# Patient Record
Sex: Male | Born: 1985 | Race: Black or African American | Hispanic: No | Marital: Married | Smoking: Current some day smoker
Health system: Southern US, Community
[De-identification: ages and names within clinical notes are randomized; demographics above are authoritative.]

## PROBLEM LIST (undated history)

## (undated) DIAGNOSIS — J189 Pneumonia, unspecified organism: Secondary | ICD-10-CM

## (undated) DIAGNOSIS — G5600 Carpal tunnel syndrome, unspecified upper limb: Secondary | ICD-10-CM

## (undated) DIAGNOSIS — R7303 Prediabetes: Secondary | ICD-10-CM

## (undated) DIAGNOSIS — M5430 Sciatica, unspecified side: Secondary | ICD-10-CM

## (undated) DIAGNOSIS — J45909 Unspecified asthma, uncomplicated: Secondary | ICD-10-CM

## (undated) DIAGNOSIS — G4730 Sleep apnea, unspecified: Secondary | ICD-10-CM

## (undated) DIAGNOSIS — F909 Attention-deficit hyperactivity disorder, unspecified type: Secondary | ICD-10-CM

## (undated) HISTORY — PX: OTHER SURGICAL HISTORY: SHX169

## (undated) HISTORY — DX: Attention-deficit hyperactivity disorder, unspecified type: F90.9

## (undated) HISTORY — PX: VARICOSE VEIN SURGERY: SHX832

## (undated) HISTORY — DX: Sciatica, unspecified side: M54.30

---

## 2018-10-02 ENCOUNTER — Emergency Department (HOSPITAL_COMMUNITY): Payer: Self-pay

## 2018-10-02 ENCOUNTER — Emergency Department (HOSPITAL_COMMUNITY)
Admission: EM | Admit: 2018-10-02 | Discharge: 2018-10-02 | Disposition: A | Discharge status: Home / Self Care | Payer: Self-pay | Attending: Emergency Medicine | Admitting: Emergency Medicine

## 2018-10-02 ENCOUNTER — Other Ambulatory Visit: Payer: Self-pay

## 2018-10-02 ENCOUNTER — Encounter (HOSPITAL_COMMUNITY): Payer: Self-pay | Admitting: Emergency Medicine

## 2018-10-02 DIAGNOSIS — M25572 Pain in left ankle and joints of left foot: Secondary | ICD-10-CM | POA: Insufficient documentation

## 2018-10-02 NOTE — Discharge Instructions (Addendum)
You were evaluated today for left ankle pain.  Your x-ray was negative for fracture dislocation.  You might have a sprain or a strain of your left ankle.  We have placed a brace.  Please continue using your crutches.  May take Tylenol and ibuprofen for pain management as well as ice and elevate the extremity.  Please follow-up with orthopedist listed on your discharge instructions if you continue to have pain.  Return to the ED for any new or worsening symptoms.

## 2018-10-02 NOTE — ED Provider Notes (Signed)
[facility]   CSN: [id number] Arrival date & time: 10/02/18  1622   History   Chief Complaint Chief Complaint  Patient presents with  . Ankle Pain    HPI Eric Blackwell is a 32 y.o. male with no symptom past medical history who presents for evaluation of left ankle pain after mechanical fall.  Patient states he was carrying a box this morning and missed a step and fell going down 2 stairs while inverting his left ankle. Patient had immediate pain to his left ankle after incident.  Rates his pain a 6/10.  Patient states his pain is located to the medial and lateral malleolus as well as the arch of his left foot.  Denies previous injuries or trauma to left lower extremity.  Patient has not taken anything for his pain.  Has been putting ice and elevating his leg since the incident.  Patient states he has been using crutches because it is painful to walk.  Denies fever, chills, nausea, vomiting, numbness, tingling in bilateral lower extremity, color change to extremity.  Denies pain to the bilateral knees, lumbar spine, hips, tibia/fibula.  Denies aggravating or alleviating factors unless otherwise stated in HPI. Denies hitting head, loss of consciousness or falling on his lower back.  History obtained from patient.  No interpreter was used.  HPI  History reviewed. No pertinent past medical history.  There are no active problems to display for this patient.   Histories are reviewed. Please review them in the "History" navigator section and refresh this SmartLink.      Home Medications    Prior to Admission medications   Not on File    Family History No family history on file.  Social History Social History   Tobacco Use  . Smoking status: Never Smoker  . Smokeless tobacco: Never Used  Substance Use Topics  . Alcohol use: Never    Frequency: Never  . Drug use: Never     Allergies   Sulfa antibiotics   Review of  Systems Review of Systems  Constitutional: Negative.   Respiratory: Negative.   Cardiovascular: Negative.   Gastrointestinal: Negative.   Musculoskeletal: Negative for back pain and joint swelling.       Left ankle pain.  Skin: Negative.   Neurological: Negative for dizziness, weakness and light-headedness.  All other systems reviewed and are negative.    Physical Exam Updated Vital Signs BP (!) 143/82 (BP Location: Right Wrist)   Pulse 77   Temp 98.3 F (36.8 C) (Oral)   Resp 16   Ht 6' (1.829 m)   Wt (!) 174.6 kg   SpO2 100%   BMI 52.22 kg/m   Physical Exam  Constitutional: He appears well-developed and well-nourished. No distress.  HENT:  Head: Atraumatic.  Eyes: Pupils are equal, round, and reactive to light.  Neck: Normal range of motion. Neck supple.  Cardiovascular: Normal rate and regular rhythm.  Cap refill normal bilateral lower extremity.  Pulmonary/Chest: Effort normal. No respiratory distress.  Abdominal: Soft. He exhibits no distension.  Musculoskeletal:       Right hip: Normal.       Left hip: Normal.       Right knee: Normal.       Left knee: Normal.       Right ankle: Normal.       Left ankle: He exhibits decreased range of motion. He exhibits no swelling, no ecchymosis, no deformity and no laceration. Tenderness.  Lateral malleolus and medial malleolus tenderness found. No AITFL, no CF ligament, no posterior TFL, no head of 5th metatarsal and no proximal fibula tenderness found. Achilles tendon exhibits no pain, no defect and normal Thompson's test results.       Lumbar back: Normal.       Right lower leg: Normal.       Left lower leg: Normal.       Right foot: Normal.       Left foot: Normal.  No midline back pain, pelvic pain or bilateral hip pain.  Decreased range of motion with plantar flexion dorsiflexion left lower extremity secondary to pain.  Unable to invert secondary to pain on left lower extremity.  Tenderness palpation over medial and  lateral malleolus.  No navicular tenderness on left lower extremity.  No bony tenderness to proximal or midshaft tibia/fibula bilateral lower extremity.  Able to straight leg raise on both lower extremity.  No tenderness to medial or lateral joint line on knees.  Bilateral lower extremity compartments are soft.  No calf swelling or pain.   Neurological: He is alert.  Intact sensation to sharp and dull bilateral lower extremity.  Skin: Skin is warm and dry. He is not diaphoretic.  No edema, erythema, ecchymosis or warmth to bilateral lower extremities.  Extremity compartments are soft.  No contusions, abrasions or lacerations.  No pallor, temperature changes to bilateral lower extremity.  No rashes.  Psychiatric: He has a normal mood and affect.  Nursing note and vitals reviewed.    ED Treatments / Results  Labs (all labs ordered are listed, but only abnormal results are displayed) Labs Reviewed - No data to display  EKG None  Radiology Dg Ankle Complete Left  Result Date: 10/02/2018 CLINICAL DATA:  Pain after trauma EXAM: LEFT ANKLE COMPLETE - 3+ VIEW COMPARISON:  None. FINDINGS: Soft tissue calcifications in the lower leg are likely nonacute. The ankle mortise is intact. No fracture. IMPRESSION: No fracture or dislocation. Electronically Signed   By: Gerome Sam III M.D   On: 10/02/2018 17:51   Dg Foot Complete Left  Result Date: 10/02/2018 CLINICAL DATA:  Pain after trauma EXAM: LEFT FOOT - COMPLETE 3+ VIEW COMPARISON:  None. FINDINGS: There is no evidence of fracture or dislocation. There is no evidence of arthropathy or other focal bone abnormality. Soft tissues are unremarkable. IMPRESSION: Negative. Electronically Signed   By: Gerome Sam III M.D   On: 10/02/2018 17:52    Procedures Procedures (including critical care time)  Medications Ordered in ED Medications - No data to display   Initial Impression / Assessment and Plan / ED Course  I have reviewed the triage  vital signs and the nursing notes.  Pertinent labs & imaging results that were available during my care of the patient were reviewed by me and considered in my medical decision making (see chart for details).  31 year old male who appears otherwise well presents for evaluation after mechanical fall for left ankle pain.  Tenderness to palpation to medial and lateral malleolus.  No tenderness to navicular.  Decreased range of motion with plantar flexion, dorsiflexion secondary to pain.  Normal musculoskeletal exam other than left ankle.  2+ capillary refill lower extremity.  Neurovascularly intact.  Lower extremity compartments are soft.  No calf swelling or tenderness.  No proximal tibia/fibula or knee pain.  No midline back pain.  Patient has been ambulating with crutches secondary to pain in left ankle with ambulation.  Has not had anything  for pain.  Will obtain plain films and reevaluate.  Plain film ankle and foot negative for fracture or dislocation.  Patient most likely with musculoskeletal sprain or strain.  Low suspicion for emergent pathology causing patient's symptoms at this time.  Will place in ASO brace as well as continue use of home crutches.  Discussed with patient RICE for symptomatic management as well as follow-up with orthopedics for reevaluation if he has continued symptoms.  Patient is hemodynamically stable and appropriate for DC home at this time.  Discussed strict return precautions.  Patient voiced understanding and is agreeable for follow-up.   Final Clinical Impressions(s) / ED Diagnoses   Final diagnoses:  Acute left ankle pain    ED Discharge Orders    None       Shanikka Wonders A, PA-C 10/02/18 1831    Sabas Sous, MD 10/02/18 2010

## 2018-10-02 NOTE — ED Triage Notes (Signed)
Pt reports that he fell down 2 steps today and is having pain in his left annkle and foot (primarily the arch) pt has own crutches in triage. NAD noted.

## 2020-08-05 ENCOUNTER — Other Ambulatory Visit: Payer: Self-pay

## 2020-10-22 ENCOUNTER — Ambulatory Visit: Payer: 59 | Attending: Critical Care Medicine

## 2020-10-22 DIAGNOSIS — Z23 Encounter for immunization: Secondary | ICD-10-CM

## 2020-10-22 NOTE — Progress Notes (Signed)
   Covid-19 Vaccination Clinic  Name:  Eric Blackwell    MRN: [id number] DOB: 04/13/1986  10/22/2020  [name] was observed post Covid-19 immunization for 15 minutes without incident. He was provided with Vaccine Information Sheet and instruction to access the V-Safe system.   [name] was instructed to call 911 with any severe reactions post vaccine: Marland Kitchen Difficulty breathing  . Swelling of face and throat  . A fast heartbeat  . A bad rash all over body  . Dizziness and weakness   Immunizations Administered    Name Date Dose VIS Date Route   Pfizer COVID-19 Vaccine 10/22/2020  2:08 PM 0.3 mL 08/19/2020 Intramuscular   Manufacturer: [employer], [employer]   Lot: [id number]   NDC: [address]-0

## 2021-03-04 DIAGNOSIS — Z20822 Contact with and (suspected) exposure to covid-19: Secondary | ICD-10-CM | POA: Diagnosis not present

## 2021-04-07 DIAGNOSIS — R946 Abnormal results of thyroid function studies: Secondary | ICD-10-CM | POA: Diagnosis not present

## 2021-04-07 DIAGNOSIS — R7309 Other abnormal glucose: Secondary | ICD-10-CM | POA: Diagnosis not present

## 2021-04-07 DIAGNOSIS — R5383 Other fatigue: Secondary | ICD-10-CM | POA: Diagnosis not present

## 2021-04-14 DIAGNOSIS — E7800 Pure hypercholesterolemia, unspecified: Secondary | ICD-10-CM | POA: Diagnosis not present

## 2021-04-14 DIAGNOSIS — R7303 Prediabetes: Secondary | ICD-10-CM | POA: Diagnosis not present

## 2021-04-14 DIAGNOSIS — Z0000 Encounter for general adult medical examination without abnormal findings: Secondary | ICD-10-CM | POA: Diagnosis not present

## 2021-05-07 DIAGNOSIS — M5459 Other low back pain: Secondary | ICD-10-CM | POA: Diagnosis not present

## 2021-06-21 DIAGNOSIS — M5416 Radiculopathy, lumbar region: Secondary | ICD-10-CM | POA: Diagnosis not present

## 2021-06-30 DIAGNOSIS — E7800 Pure hypercholesterolemia, unspecified: Secondary | ICD-10-CM | POA: Diagnosis not present

## 2021-06-30 DIAGNOSIS — M5431 Sciatica, right side: Secondary | ICD-10-CM | POA: Diagnosis not present

## 2021-06-30 DIAGNOSIS — M25561 Pain in right knee: Secondary | ICD-10-CM | POA: Diagnosis not present

## 2021-07-02 ENCOUNTER — Other Ambulatory Visit: Payer: Self-pay | Admitting: General Surgery

## 2021-07-02 ENCOUNTER — Other Ambulatory Visit (HOSPITAL_COMMUNITY): Payer: Self-pay | Admitting: General Surgery

## 2021-07-02 DIAGNOSIS — E7800 Pure hypercholesterolemia, unspecified: Secondary | ICD-10-CM

## 2021-07-02 DIAGNOSIS — R7303 Prediabetes: Secondary | ICD-10-CM

## 2021-07-12 ENCOUNTER — Other Ambulatory Visit: Payer: Self-pay

## 2021-07-12 ENCOUNTER — Encounter: Payer: Self-pay | Admitting: Skilled Nursing Facility1

## 2021-07-12 ENCOUNTER — Encounter: Payer: BC Managed Care – PPO | Attending: General Surgery | Admitting: Skilled Nursing Facility1

## 2021-07-12 DIAGNOSIS — E669 Obesity, unspecified: Secondary | ICD-10-CM | POA: Insufficient documentation

## 2021-07-12 NOTE — Progress Notes (Signed)
Nutrition Assessment for Bariatric Surgery Medical Nutrition Therapy Appt Start Time: 2:30    End Time: 3:00  Patient was seen on 07/12/2021 for Pre-Operative Nutrition Assessment. Letter of approval faxed to [facility] bariatric surgery program coordinator on 07/12/2021.   Referral stated Supervised Weight Loss (SWL) visits needed: 0  Not cleared at this time:  Pt to follow up for minimum of one more visit to assist pt with progressing through stages of change/further nutrition education. RD advised pt that this follow up visit is not mandated through insurance. Pt verbalized agreement.   Pt arrived too late to complete assessment so second appt was set up to complete this appt  Planned surgery: Sleeve or RYGB Pt expectation of surgery: to lose weight Pt expectation of dietitian: to help educate     NUTRITION ASSESSMENT   Anthropometrics  Start weight at [facility]: 404.6 lbs (date: 07/12/2021)  Height: 72 in BMI: 54.87 kg/m2     Clinical  Medical hx: ADHD, sciatica  Medications: N/A  Labs:  Notable signs/symptoms: back pain Any previous deficiencies? No  Micronutrient Nutrition Focused Physical Exam: Hair: No issues observed Eyes: No issues observed Mouth: No issues observed Neck: No issues observed Nails: No issues observed Skin: No issues observed  Lifestyle & Dietary Hx  Pt states he is sensitive to avocado.  Pt states he is unable to be physically active due to his back pain.   24-Hr Dietary Recall First Meal: 2 slices wheat toast + cashew butter Snack:  Second Meal: deli meat + cheese Snack: chips + salsa Third Meal: ordered out Snack:  Beverages: coffee+ half and half, water, alcohol   Estimated Energy Needs Calories: 1800   NUTRITION DIAGNOSIS  Overweight/obesity ([place]-3.3) related to past poor dietary habits and physical inactivity as evidenced by patient w/ planned sleeve or RYGB surgery following dietary guidelines for continued weight  loss.    NUTRITION INTERVENTION  Nutrition counseling (C-1) and education (E-2) to facilitate bariatric surgery goals.  Educated pt on micronutrient deficiencies post surgery and strategies to mitigate that risk   Pre-Op Goals Reviewed with the Patient Track food and beverage intake (pen and paper, MyFitness Pal, Baritastic app, etc.) Make healthy food choices while monitoring portion sizes Consume 3 meals per day or try to eat every 3-5 hours Avoid concentrated sugars and fried foods Keep sugar & fat in the single digits per serving on food labels Practice CHEWING your food (aim for applesauce consistency) Practice not drinking 15 minutes before, during, and 30 minutes after each meal and snack Avoid all carbonated beverages (ex: soda, sparkling beverages)  Limit caffeinated beverages (ex: coffee, tea, energy drinks) Avoid all sugar-sweetened beverages (ex: regular soda, sports drinks)  Avoid alcohol  Aim for 64-100 ounces of FLUID daily (with at least half of fluid intake being plain water)  Aim for at least 60-80 grams of PROTEIN daily Look for a liquid protein source that contains ?15 g protein and ?5 g carbohydrate (ex: shakes, drinks, shots) Make a list of non-food related activities Physical activity is an important part of a healthy lifestyle so keep it moving! The goal is to reach 150 minutes of exercise per week, including cardiovascular and weight baring activity. Look into support group information sent via email Complete the mindful meals handout  *Goals that are bolded indicate the pt would like to start working towards these  Handouts Provided Include  Bariatric Surgery handouts (Nutrition Visits, Pre-Op Goals, Protein Shakes, Vitamins & Minerals)  Learning Style &  Readiness for Change Teaching method utilized: Visual & Auditory  Demonstrated degree of understanding via: Teach Back  Readiness Level: contemplative  Barriers to learning/adherence to lifestyle change:  unidentified   RD's Notes for Next Visit Assess pts adherence to chosen goals     MONITORING & EVALUATION Dietary intake, weekly physical activity, body weight, and pre-op goals reached at next nutrition visit.    Next Steps  Patient is to follow up at [facility] for Pre-Op Class >2 weeks before surgery for further nutrition education.

## 2021-07-14 ENCOUNTER — Other Ambulatory Visit: Payer: Self-pay

## 2021-07-14 ENCOUNTER — Ambulatory Visit (HOSPITAL_COMMUNITY)
Admission: RE | Admit: 2021-07-14 | Discharge: 2021-07-14 | Disposition: A | Discharge status: Home / Self Care | Payer: BC Managed Care – PPO | Source: Ambulatory Visit | Attending: General Surgery | Admitting: General Surgery

## 2021-07-14 DIAGNOSIS — R7303 Prediabetes: Secondary | ICD-10-CM | POA: Insufficient documentation

## 2021-07-14 DIAGNOSIS — E7800 Pure hypercholesterolemia, unspecified: Secondary | ICD-10-CM | POA: Diagnosis not present

## 2021-07-14 DIAGNOSIS — K449 Diaphragmatic hernia without obstruction or gangrene: Secondary | ICD-10-CM | POA: Diagnosis not present

## 2021-07-14 DIAGNOSIS — Z01818 Encounter for other preprocedural examination: Secondary | ICD-10-CM | POA: Diagnosis not present

## 2021-08-02 DIAGNOSIS — F5089 Other specified eating disorder: Secondary | ICD-10-CM | POA: Diagnosis not present

## 2021-08-06 DIAGNOSIS — Z113 Encounter for screening for infections with a predominantly sexual mode of transmission: Secondary | ICD-10-CM | POA: Diagnosis not present

## 2021-08-06 DIAGNOSIS — Z118 Encounter for screening for other infectious and parasitic diseases: Secondary | ICD-10-CM | POA: Diagnosis not present

## 2021-08-09 ENCOUNTER — Encounter: Payer: BC Managed Care – PPO | Attending: General Surgery | Admitting: Skilled Nursing Facility1

## 2021-08-09 ENCOUNTER — Other Ambulatory Visit: Payer: Self-pay

## 2021-08-09 DIAGNOSIS — E669 Obesity, unspecified: Secondary | ICD-10-CM | POA: Insufficient documentation

## 2021-08-09 NOTE — Progress Notes (Signed)
Nutrition Assessment for Bariatric Surgery Medical Nutrition Therapy Appt Start Time:   2:48 End Time: 3:20  Patient was seen on 07/12/2021 for Pre-Operative Nutrition Assessment. Letter of approval faxed to [facility] bariatric surgery program coordinator on 07/12/2021.   Referral stated Supervised Weight Loss (SWL) visits needed: 0  Pt completed visits.   Pt has cleared nutrition requirements.    Planned surgery: Sleeve or RYGB Pt expectation of surgery: to lose weight Pt expectation of dietitian: to help educate     NUTRITION ASSESSMENT   Anthropometrics  Start weight at [facility]: 404.6 lbs (date: 07/12/2021)  Weight: 402.6 pounds BMI: 54.56 kg/m2     Clinical  Medical hx: ADHD, sciatica  Medications: N/A  Labs:  Notable signs/symptoms: back pain Any previous deficiencies? No  Micronutrient Nutrition Focused Physical Exam: Hair: No issues observed Eyes: No issues observed Mouth: No issues observed Neck: No issues observed Nails: No issues observed Skin: No issues observed  Lifestyle & Dietary Hx   Pt states work has been hectic lately but states it should calm down soon.  Pt states he did do the mindful meals sheet which helped him to slow down and think about his foods having learned his ADHD affects him and keeps him from remembering to eat so skips meals also realizing a stressful day will result in eating later and eating calorically dense food inclusive of more cravings and also eating out more often.  Pt states he needs to sue his weekends to prep for foods and also destress for the week but has been working the weekends. Pt states he plans to make a monthly grocery list with his wife in order to create plans as a unit.   24-Hr Dietary Recall First Meal: 2 slices wheat toast + cashew butter Snack:  Second Meal: deli meat + cheese Snack: chips + salsa Third Meal: mediteranian salad Snack:  Beverages: coffee+ half and half, water, alcohol    Estimated Energy Needs Calories: 1800   NUTRITION DIAGNOSIS  Overweight/obesity ([place]-3.3) related to past poor dietary habits and physical inactivity as evidenced by patient w/ planned sleeve or RYGB surgery following dietary guidelines for continued weight loss.    NUTRITION INTERVENTION  Nutrition counseling (C-1) and education (E-2) to facilitate bariatric surgery goals.  Educated pt on micronutrient deficiencies post surgery and strategies to mitigate that risk   Pre-Op Goals Reviewed with the Patient Track food and beverage intake (pen and paper, MyFitness Pal, Baritastic app, etc.) Make healthy food choices while monitoring portion sizes Consume 3 meals per day or try to eat every 3-5 hours Avoid concentrated sugars and fried foods Keep sugar & fat in the single digits per serving on food labels Practice CHEWING your food (aim for applesauce consistency) Practice not drinking 15 minutes before, during, and 30 minutes after each meal and snack Avoid all carbonated beverages (ex: soda, sparkling beverages)  Limit caffeinated beverages (ex: coffee, tea, energy drinks) Avoid all sugar-sweetened beverages (ex: regular soda, sports drinks)  Avoid alcohol  Aim for 64-100 ounces of FLUID daily (with at least half of fluid intake being plain water)  Aim for at least 60-80 grams of PROTEIN daily Look for a liquid protein source that contains ?15 g protein and ?5 g carbohydrate (ex: shakes, drinks, shots) Make a list of non-food related activities Physical activity is an important part of a healthy lifestyle so keep it moving! The goal is to reach 150 minutes of exercise per week, including cardiovascular and weight baring  activity.   *Goals that are bolded indicate the pt would like to start working towards these  Handouts Provided Include  Bariatric Surgery handouts (Nutrition Visits, Pre-Op Goals, Protein Shakes, Vitamins & Minerals)  Learning Style & Readiness for  Change Teaching method utilized: Visual & Auditory  Demonstrated degree of understanding via: Teach Back  Readiness Level: contemplative  Barriers to learning/adherence to lifestyle change: unidentified   RD's Notes for Next Visit Assess pts adherence to chosen goals     MONITORING & EVALUATION Dietary intake, weekly physical activity, body weight, and pre-op goals reached at next nutrition visit.    Next Steps  Patient is to follow up at [facility] for Pre-Op Class >2 weeks before surgery for further nutrition education.  Pt has completed visits. No further supervised visits required

## 2021-08-12 DIAGNOSIS — Z113 Encounter for screening for infections with a predominantly sexual mode of transmission: Secondary | ICD-10-CM | POA: Diagnosis not present

## 2021-08-30 DIAGNOSIS — M79661 Pain in right lower leg: Secondary | ICD-10-CM | POA: Diagnosis not present

## 2021-08-30 DIAGNOSIS — R269 Unspecified abnormalities of gait and mobility: Secondary | ICD-10-CM | POA: Diagnosis not present

## 2021-08-30 DIAGNOSIS — M5416 Radiculopathy, lumbar region: Secondary | ICD-10-CM | POA: Diagnosis not present

## 2021-08-30 DIAGNOSIS — M25651 Stiffness of right hip, not elsewhere classified: Secondary | ICD-10-CM | POA: Diagnosis not present

## 2021-09-02 DIAGNOSIS — M25651 Stiffness of right hip, not elsewhere classified: Secondary | ICD-10-CM | POA: Diagnosis not present

## 2021-09-02 DIAGNOSIS — R269 Unspecified abnormalities of gait and mobility: Secondary | ICD-10-CM | POA: Diagnosis not present

## 2021-09-02 DIAGNOSIS — M5416 Radiculopathy, lumbar region: Secondary | ICD-10-CM | POA: Diagnosis not present

## 2021-09-02 DIAGNOSIS — M79661 Pain in right lower leg: Secondary | ICD-10-CM | POA: Diagnosis not present

## 2021-09-08 DIAGNOSIS — F4322 Adjustment disorder with anxiety: Secondary | ICD-10-CM | POA: Diagnosis not present

## 2021-09-09 DIAGNOSIS — M25651 Stiffness of right hip, not elsewhere classified: Secondary | ICD-10-CM | POA: Diagnosis not present

## 2021-09-09 DIAGNOSIS — M79661 Pain in right lower leg: Secondary | ICD-10-CM | POA: Diagnosis not present

## 2021-09-09 DIAGNOSIS — M5416 Radiculopathy, lumbar region: Secondary | ICD-10-CM | POA: Diagnosis not present

## 2021-09-09 DIAGNOSIS — R269 Unspecified abnormalities of gait and mobility: Secondary | ICD-10-CM | POA: Diagnosis not present

## 2021-09-15 DIAGNOSIS — M25651 Stiffness of right hip, not elsewhere classified: Secondary | ICD-10-CM | POA: Diagnosis not present

## 2021-09-15 DIAGNOSIS — M79661 Pain in right lower leg: Secondary | ICD-10-CM | POA: Diagnosis not present

## 2021-09-15 DIAGNOSIS — R269 Unspecified abnormalities of gait and mobility: Secondary | ICD-10-CM | POA: Diagnosis not present

## 2021-09-15 DIAGNOSIS — M5416 Radiculopathy, lumbar region: Secondary | ICD-10-CM | POA: Diagnosis not present

## 2021-09-30 DIAGNOSIS — F4322 Adjustment disorder with anxiety: Secondary | ICD-10-CM | POA: Diagnosis not present

## 2021-10-04 ENCOUNTER — Encounter: Payer: Self-pay | Admitting: Neurology

## 2021-10-04 ENCOUNTER — Ambulatory Visit: Payer: BC Managed Care – PPO | Admitting: Neurology

## 2021-10-04 ENCOUNTER — Other Ambulatory Visit: Payer: Self-pay

## 2021-10-04 VITALS — BP 137/83 | HR 89 | Ht 72.0 in | Wt >= 6400 oz

## 2021-10-04 DIAGNOSIS — E669 Obesity, unspecified: Secondary | ICD-10-CM | POA: Diagnosis not present

## 2021-10-04 DIAGNOSIS — R0683 Snoring: Secondary | ICD-10-CM | POA: Diagnosis not present

## 2021-10-04 DIAGNOSIS — Z01818 Encounter for other preprocedural examination: Secondary | ICD-10-CM | POA: Insufficient documentation

## 2021-10-04 NOTE — Patient Instructions (Signed)
Pre bariatric surgery assessment.:  1)  high risk of sleep apnea, but never has been told that apnea was witnessed. Only snoring. Most loudly in supine.  2)  anatomical risk factor , neck and airway, BMI,    My Plan is to proceed with:  1)  HST or in lab PSG SPLIT at 20 AHI.

## 2021-10-04 NOTE — Progress Notes (Signed)
SLEEP MEDICINE CLINIC    Provider:  Melvyn Novas, MD  Primary Care Physician:  Irena Reichmann, DO [address] [place] [address] [place] [address]     Referring Provider: Gaynelle Adu, Md [address] [facility] [place],  [place] [address]          Chief Complaint according to patient   Patient presents with:     New Patient (Initial Visit)           HISTORY OF PRESENT ILLNESS:  Eric Blackwell is a 35 -year old African American male patient seen here as a referral on 10/04/2021 from [place] surgeon Dr. Gaynelle Adu, MD.  for a sleep study and consultation. .  Chief concern according to patient :   Presents today for sleep consult. He has never had a sleep study. Candidate for bariatric surgery and they referred to have sleep eval. Overall has 7/8 hrs of sleep and states that its mostly solid sleep. States that he does snore in sleep and wakes up feeling well rested.    I have the pleasure of seeing Eric Blackwell on 10-04-2021 who has no medical history on file, but clearly has Morbid obesity, rports ADHD, Sciatica and strained ankle. .    Sleep relevant medical history:    Family medical /sleep history: no other family member on CPAP with OSA, insomnia, sleep walkers.    Social history:  Patient is working as a Air cabin crew and lives in a household with spouse and 2 children.  2 dogs are present. The patient currently used to work in shifts( night/ rotating,) until 12 year ago.  Tobacco use, rare .  ETOH use 5-10/ week,  Caffeine intake in form of Coffee( 13 ounces a day) Soda( /) Tea ( rare) or energy drinks. Regular exercise ;none.   Hobbies : Clinical research associate, hip-hop.       Sleep habits are as follows: The patient's dinner time is between -67 PM. The patient goes to bed at 10 PM and continues to sleep for 6-7 hours, he does not wake for  bathroom breaks.  Bedroom is col, quiet and dark- The preferred sleep position is supine, prone, with the support of 1-2  pillows. Has GERD. Dreams are reportedly frequent/vivid. SNORING LOUDLY.   Around 6.15 AM is the usual rise time. The patient wakes up spontaneously between 5-6 AM and for the last 5 months needed an alarm clock.  He reports usually feeling refreshed or restored in AM, with symptoms such as dry mouth and residual fatigue.  Naps are taken infrequently.    Review of Systems: Out of a complete 14 system review, the patient complains of only the following symptoms, and all other reviewed systems are negative.:  Fatigue, sleepiness , snoring, usually fragmented sleep,    How likely are you to doze in the following situations: 0 = not likely, 1 = slight chance, 2 = moderate chance, 3 = high chance   Sitting and Reading? Watching Television? Sitting inactive in a public place (theater or meeting)? As a passenger in a car for an hour without a break? Lying down in the afternoon when circumstances permit? Sitting and talking to someone? Sitting quietly after lunch without alcohol? In a car, while stopped for a few minutes in traffic?   Total = 4/ 24 points   FSS endorsed at 27/ 63 points.   Social History   Socioeconomic History   Marital status: Married    Spouse name: Not  on file   Number of children: Not on file   Years of education: Not on file   Highest education level: Not on file  Occupational History   Not on file  Tobacco Use   Smoking status: Never   Smokeless tobacco: Never  Substance and Sexual Activity   Alcohol use: Never   Drug use: Never   Sexual activity: Not on file  Other Topics Concern   Not on file  Social History Narrative   Not on file   Social Determinants of Health   Financial Resource Strain: Not on file  Food Insecurity: Not on file  Transportation Needs: Not on file  Physical Activity: Not on file  Stress: Not on file  Social Connections: Not on file    No family history on file.  Past Medical History:  Diagnosis Date   ADHD    Sciatica      No past surgical history on file.   No current outpatient medications on file prior to visit.   No current facility-administered medications on file prior to visit.    Allergies  Allergen Reactions   Sulfa Antibiotics     Physical exam:  Today's Vitals   10/04/21 0856  BP: 137/83  Pulse: 89  Weight: (!) 403 lb (182.8 kg)  Height: 6' (1.829 m)   Body mass index is 54.66 kg/m.   Wt Readings from Last 3 Encounters:  10/04/21 (!) 403 lb (182.8 kg)  08/09/21 (!) 402 lb 4.8 oz (182.5 kg)  07/12/21 (!) 404 lb 9.6 oz (183.5 kg)     Ht Readings from Last 3 Encounters:  10/04/21 6' (1.829 m)  08/09/21 6' (1.829 m)  07/12/21 6' (1.829 m)      General: The patient is awake, alert and appears not in acute distress. The patient is well groomed. Head: Normocephalic, atraumatic. Neck is supple. Mallampati 3 plus ,  neck circumference:20 inches . Nasal airflow patent.  Retrognathia is  seen.  Dental status: had braces .  Cardiovascular:  Regular rate and cardiac rhythm by pulse,  without distended neck veins. Respiratory: Lungs are clear to auscultation.  Skin:  Without evidence of ankle edema, or rash. Trunk: The patient's posture is erect.   Neurologic exam : The patient is awake and alert, oriented to place and time.   Memory subjective described as intact.  Attention span & concentration ability appears normal.  Speech is fluent,  without  dysarthria, dysphonia or aphasia.  Mood and affect are appropriate.   Cranial nerves: no loss of smell or taste reported  Pupils are equal and briskly reactive to light. Funduscopic exam deferred. .  Extraocular movements in vertical and horizontal planes were intact and without nystagmus. No Diplopia. Visual fields by finger perimetry are intact. Hearing was intact to soft voice and finger rubbing.    Facial sensation intact to fine touch.  Facial motor strength is symmetric and tongue and uvula move midline.  Neck ROM :  rotation, tilt and flexion extension were normal for age and shoulder shrug was symmetrical.    Motor exam:  Symmetric bulk, tone and ROM.   Normal tone without cog wheeling, symmetric grip strength .   Sensory:  Fine touch and vibration were normal.  Proprioception tested in the upper extremities was normal.   Coordination: Rapid alternating movements in the fingers/hands were of normal speed.  The Finger-to-nose maneuver was intact without evidence of ataxia, dysmetria or tremor.   Gait and station: Patient could rise unassisted  from a seated position, walked without assistive device.  Stance is of normal width/ base.  Toe and heel walk were deferred.  Deep tendon reflexes: in the upper and lower extremities are symmetric and intact.  Babinski response was deferred .       After spending a total time of  45  minutes: this includes paper referral review from outside the system,  face to face and additional time for physical and neurologic examination, review of laboratory studies,  personal review of imaging studies, reports and results of other testing and review of referral information / records as far as provided in visit, I have established the following assessments:   Pre bariatric surgery assessment.:  1)  high risk of sleep apnea, but never has been told that apnea was witnessed. Only snoring. Most loudly in supine.  2)  anatomical risk factor , neck and airway, BMI,    My Plan is to proceed with:  Presurgical assessment:   Based on Dr. Tawana Scale notes the patient has had complications from his severe obesity including bilateral chronic knee pain, sciatica, foot pain with Achilles tendon inflammation.  He is at high risk for obstructive sleep apnea and is considered prediabetic and hyper cholesterolemia.  He has not had a diagnosis of hypertension has no known coronary heart disease and no known cerebrovascular disease.    The intended surgery would be a laparoscopic vertical  sleeve gastrectomy.  1)  HST or in lab PSG SPLIT at 20 AHI.      I would like to thank Irena Reichmann, DO and Gaynelle Adu, Md [address] [place],  [place] [address] for allowing me to meet with and to take care of this pleasant patient.   In short, Eric Blackwell is presenting with snoring in te setting of super obesity, I plan to follow up either personally or through our NP within 2-4  months.   CC: I will share my notes with PCP.  Electronically signed by: Melvyn Novas, MD 10/04/2021 9:16 AM  [facility] Board certified by The [employer] of Sleep Medicine and Diplomate of the [employer] of Sleep Medicine. Board certified In Neurology through the ABPN, Fellow of the [employer] of Neurology. Medical Director of [facility].

## 2021-10-11 DIAGNOSIS — M5416 Radiculopathy, lumbar region: Secondary | ICD-10-CM | POA: Diagnosis not present

## 2021-10-11 DIAGNOSIS — R269 Unspecified abnormalities of gait and mobility: Secondary | ICD-10-CM | POA: Diagnosis not present

## 2021-10-11 DIAGNOSIS — M25651 Stiffness of right hip, not elsewhere classified: Secondary | ICD-10-CM | POA: Diagnosis not present

## 2021-10-11 DIAGNOSIS — M79661 Pain in right lower leg: Secondary | ICD-10-CM | POA: Diagnosis not present

## 2021-10-14 DIAGNOSIS — M25651 Stiffness of right hip, not elsewhere classified: Secondary | ICD-10-CM | POA: Diagnosis not present

## 2021-10-14 DIAGNOSIS — R269 Unspecified abnormalities of gait and mobility: Secondary | ICD-10-CM | POA: Diagnosis not present

## 2021-10-14 DIAGNOSIS — M5416 Radiculopathy, lumbar region: Secondary | ICD-10-CM | POA: Diagnosis not present

## 2021-10-14 DIAGNOSIS — M79661 Pain in right lower leg: Secondary | ICD-10-CM | POA: Diagnosis not present

## 2021-10-18 DIAGNOSIS — F4322 Adjustment disorder with anxiety: Secondary | ICD-10-CM | POA: Diagnosis not present

## 2021-10-20 ENCOUNTER — Ambulatory Visit (INDEPENDENT_AMBULATORY_CARE_PROVIDER_SITE_OTHER): Payer: BC Managed Care – PPO | Admitting: Neurology

## 2021-10-20 DIAGNOSIS — G4733 Obstructive sleep apnea (adult) (pediatric): Secondary | ICD-10-CM

## 2021-10-21 DIAGNOSIS — Z118 Encounter for screening for other infectious and parasitic diseases: Secondary | ICD-10-CM | POA: Diagnosis not present

## 2021-10-21 DIAGNOSIS — Z113 Encounter for screening for infections with a predominantly sexual mode of transmission: Secondary | ICD-10-CM | POA: Diagnosis not present

## 2021-10-26 NOTE — Progress Notes (Signed)
[facility]   HOME SLEEP TEST REPORT ( by Watch PAT)   STUDY DATE:  10-20-2021    ORDERING CLINICIAN: Melvyn Novas, MD  REFERRING CLINICIAN: Gaynelle Adu, MD , [facility] Surgery/ Bariatric Surgery  PCP: Irena Reichmann, DO    CLINICAL INFORMATION/HISTORY: Eric Blackwell is a 35 -year old African American male patient seen here as a referral on 10/04/2021 from [place] surgeon Dr. Gaynelle Adu, MD.  for a sleep study and consultation. .  Chief concern according to patient :   Presents today for sleep consult. He has never had a sleep study. Candidate for bariatric surgery and  referred to have sleep consultation. Overall , he usually between 7 and 8 hours of solid sleep. States that he does snore but wakes up feeling well rested.    I have the pleasure of seeing Eric Blackwell on 10-04-2021 who has no medical history on file, but clearly has Morbid obesity, reports ADHD, Sciatica and a strained ankle.    Epworth sleepiness score: 4/24.   BMI: 55 kg/m   Neck Circumference: 20"   FINDINGS:   Sleep Summary:   Total Recording Time (hours, min): Total recording time is 9 hours and 55 minutes of which 8 hours and 22 minutes was a total sleep time recording.  Calculated REM sleep was 18.4%.                                     Respiratory Indices:   Calculated pAHI (per hour):    AHI per hour was 23.1 with a REM sleep AHI much higher at 51.1/h versus a non-REM sleep apnea hypopnea index of 16.8/h.  Positional apnea-hypopnea index was highest in supine position at 26.4 followed by a right-sided sleep AHI of 25.9 and by prone sleep with 16.4/h.  Snoring statistics show a slight above average volume of 43 dB with 45% of total sleep time accompanied by snoring.                                                                Oxygen Saturation Statistics:  O2 Saturation Range (%): Oxygen saturation varied between a nadir of 87 and a maximum 100% with a mean  oxygen saturation at 95%.                                       O2 Saturation (minutes) <89%:   Was 0.3 minutes only.        Pulse Rate Statistics:   Pulse Range:   Between 42 and 99 bpm with a mean heart rate of 71 bpm.  Please note that this home sleep test can only provide heart rate but not cardiac rhythm data.              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  There is a trend to bradycardia intermittently but no significant hypoxia associated.  Sleep position did not seem to matter very much in terms of severity of apnea and hypopnea indices.  Based  on this I would recommend using an auto titration CPAP.   RECOMMENDATION: CPAP autotitrator should be set between 6 and 20 cmH2O pressure with 3 cm EPR, heated humidification and a mask of patient's choice.  Since the patient cannot sleep on either side but prefers to sleep supine, I would leave it to his comfort to choose between a nasal mask and a full facemask.  A revisit will be within 30 to 90 days of CPAP use.  Please remind the patient that CPAP compliance consists of 4 hours or more of nightly use.  If the mask is not comfortable he has to contact the DMV not our office.    INTERPRETING PHYSICIAN:   Melvyn Novas, MD   Medical Director of [facility].

## 2021-11-04 DIAGNOSIS — E7800 Pure hypercholesterolemia, unspecified: Secondary | ICD-10-CM | POA: Diagnosis not present

## 2021-11-04 DIAGNOSIS — M5431 Sciatica, right side: Secondary | ICD-10-CM | POA: Diagnosis not present

## 2021-11-04 DIAGNOSIS — M25561 Pain in right knee: Secondary | ICD-10-CM | POA: Diagnosis not present

## 2021-11-08 ENCOUNTER — Telehealth: Payer: Self-pay | Admitting: Neurology

## 2021-11-08 DIAGNOSIS — G4733 Obstructive sleep apnea (adult) (pediatric): Secondary | ICD-10-CM | POA: Insufficient documentation

## 2021-11-08 NOTE — Progress Notes (Signed)
REFERRING CLINICIAN:Eric Andrey Campanile, MD , [facility] Bariatric Surgery.  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  There is a trend to bradycardia intermittently but no significant hypoxia associated.  Sleep position did not seem to matter very much in terms of severity of apnea and hypopnea indices.  Based on this I would recommend using an auto titration CPAP.  RECOMMENDATION: CPAP autotitrator should be set between 6 and 20 cmH2O pressure with 3 cm EPR, heated humidification and a mask of patient's choice.  Since the patient cannot sleep on either side but prefers to sleep supine, I would leave it to his comfort to choose between a nasal mask and a full facemask.  A revisit will be within 30 to 90 days of CPAP use.  Please remind the patient that CPAP compliance consists of 4 hours or more of nightly use.  If the mask is not comfortable he has to contact the DMV not our office.   INTERPRETING PHYSICIAN:

## 2021-11-08 NOTE — Addendum Note (Signed)
Addended by: Melvyn Novas on: 11/08/2021 01:05 PM   Modules accepted: Orders

## 2021-11-08 NOTE — Telephone Encounter (Signed)
-----   Message from Melvyn Novas, MD sent at 11/08/2021  1:04 PM EST ----- REFERRING CLINICIAN:Eric Andrey Campanile, MD , [facility] Surgery/ Bariatric Surgery.  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  There is a trend to bradycardia intermittently but no significant hypoxia associated.  Sleep position did not seem to matter very much in terms of severity of apnea and hypopnea indices.  Based on this I would recommend using an auto titration CPAP.  RECOMMENDATION: CPAP autotitrator should be set between 6 and 20 cmH2O pressure with 3 cm EPR, heated humidification and a mask of patient's choice.  Since the patient cannot sleep on either side but prefers to sleep supine, I would leave it to his comfort to choose between a nasal mask and a full facemask.  A revisit will be within 30 to 90 days of CPAP use.  Please remind the patient that CPAP compliance consists of 4 hours or more of nightly use.  If the mask is not comfortable he has to contact the DMV not our office.   INTERPRETING PHYSICIAN:

## 2021-11-08 NOTE — Telephone Encounter (Signed)
Called patient to discuss sleep study results. No answer at this time. LVM for the patient to call back.   

## 2021-11-08 NOTE — Procedures (Signed)
° ° °  °  °[facility] °  °HOME SLEEP TEST REPORT ( by Watch PAT)   °STUDY DATE:  10-20-2021 ° °  °ORDERING CLINICIAN: Latash Nouri, MD  °REFERRING CLINICIAN: Eric Wilson, MD , [facility]/ Bariatric Surgery ° °PCP: Dana Collins, DO  °  °CLINICAL INFORMATION/HISTORY: Eric Blackwell is a 36 -year old African American male patient seen here as a referral on 10/04/2021 from [place] surgeon Dr. Eric Wilson, MD.  for a sleep study and consultation. .  °Chief concern according to patient :   Presents today for sleep consult. He has never had a sleep study. Candidate for bariatric surgery and  referred to have sleep consultation. Overall , he usually between 7 and 8 hours of solid sleep. States that he does snore but wakes up feeling well rested.  °  °I have the pleasure of seeing Eric Blackwell on 10-04-2021 who has no medical history on file, but clearly has Morbid obesity, reports ADHD, Sciatica and a strained ankle. ° °  °Epworth sleepiness score: 4/24. °  °BMI: 55 kg/m² °  °Neck Circumference: 20" °  °FINDINGS: °  °Sleep Summary: °  °Total Recording Time (hours, min): Total recording time is 9 hours and 55 minutes of which 8 hours and 22 minutes was a total sleep time recording.  Calculated REM sleep was 18.4%.    °                               °  °Respiratory Indices: °  °Calculated pAHI (per hour):    AHI per hour was 23.1 with a REM sleep AHI much higher at 51.1/h versus a non-REM sleep apnea hypopnea index of 16.8/h. ° °Positional apnea-hypopnea index was highest in supine position at 26.4 followed by a right-sided sleep AHI of 25.9 and by prone sleep with 16.4/h. ° °Snoring statistics show a slight above average volume of 43 dB with 45% of total sleep time accompanied by snoring.                       °                                       °  °Oxygen Saturation Statistics: ° O2 Saturation Range (%): Oxygen saturation varied between a nadir of 87 and a maximum 100% with a mean  oxygen saturation at 95%.                                     °  °O2 Saturation (minutes) <89%:   Was 0.3 minutes only.      °  °Pulse Rate Statistics: °  °Pulse Range:   Between 42 and 99 bpm with a mean heart rate of 71 bpm.  Please note that this home sleep test can only provide heart rate but not cardiac rhythm data.            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  There is a trend to bradycardia intermittently but no significant hypoxia associated.  Sleep position did not seem to matter very much in terms of severity of apnea and hypopnea indices.  Based   on this I would recommend using an auto titration CPAP. °  °RECOMMENDATION: CPAP autotitrator should be set between 6 and 20 cmH2O pressure with 3 cm EPR, heated humidification and a mask of patient's choice.  Since the patient cannot sleep on either side but prefers to sleep supine, I would leave it to his comfort to choose between a nasal mask and a full facemask.  A revisit will be within 30 to 90 days of CPAP use.  Please remind the patient that CPAP compliance consists of 4 hours or more of nightly use.  If the mask is not comfortable he has to contact the DMV not our office. ° °  °INTERPRETING PHYSICIAN: ° ° Lemoine Goyne, MD  ° °Medical Director of [facility].  ° ° ° ° ° ° ° ° ° ° ° ° ° ° ° ° ° ° ° ° °

## 2021-11-10 NOTE — Telephone Encounter (Signed)
Called the pt. There was no answer and VM was full. Will make another attempt to call the pt

## 2021-11-15 ENCOUNTER — Ambulatory Visit: Payer: BC Managed Care – PPO

## 2021-11-16 ENCOUNTER — Encounter: Payer: Self-pay | Admitting: Neurology

## 2021-11-22 ENCOUNTER — Other Ambulatory Visit: Payer: Self-pay

## 2021-11-22 ENCOUNTER — Encounter: Payer: BC Managed Care – PPO | Attending: General Surgery | Admitting: Skilled Nursing Facility1

## 2021-11-22 NOTE — Progress Notes (Addendum)
Pre-Operative Nutrition Class:    Patient was seen on 11/22/2021 for Pre-Operative Bariatric Surgery Education at the [facility].    Surgery date: 12/06/2021 Surgery type:  Start weight at [facility]: 404.6 pounds Weight today: pt arrived too late  Samples given per MNT protocol. Patient educated on appropriate usage: Ensure max exp: December 29, 2021 Ensure max lot: [id number]  Chewable bariatric advantage: advanced multi EA exp: 08/23 Chewable bariatric advantage: advanced multi EA lot: [id number]  Bariatric advantage calcium citrate exp: 02/23 Bariatric advantage calcium citrate lot: [id number]   The following the learning objectives were met by the patient during this course: Identify Pre-Op Dietary Goals and will begin 2 weeks pre-operatively Identify appropriate sources of fluids and proteins  State protein recommendations and appropriate sources pre and post-operatively Identify Post-Operative Dietary Goals and will follow for 2 weeks post-operatively Identify appropriate multivitamin and calcium sources Describe the need for physical activity post-operatively and will follow MD recommendations State when to call healthcare provider regarding medication questions or post-operative complications When having a diagnosis of diabetes understanding hypoglycemia symptoms and the inclusion of 1 complex carbohydrate per meal  Handouts given during class include: Pre-Op Bariatric Surgery Diet Handout Protein Shake Handout Post-Op Bariatric Surgery Nutrition Handout BELT Program Information Flyer Support Group Information Flyer [facility] Bariatric Supplements Price List  Follow-Up Plan: Patient will follow-up at [facility] 2 weeks post operatively for diet advancement per MD.

## 2021-11-25 DIAGNOSIS — F332 Major depressive disorder, recurrent severe without psychotic features: Secondary | ICD-10-CM | POA: Diagnosis not present

## 2021-11-25 DIAGNOSIS — F900 Attention-deficit hyperactivity disorder, predominantly inattentive type: Secondary | ICD-10-CM | POA: Diagnosis not present

## 2021-11-25 DIAGNOSIS — F4311 Post-traumatic stress disorder, acute: Secondary | ICD-10-CM | POA: Diagnosis not present

## 2021-12-06 NOTE — Progress Notes (Signed)
Surgery orders requested via Epic °

## 2021-12-07 ENCOUNTER — Ambulatory Visit: Payer: Self-pay | Admitting: General Surgery

## 2021-12-07 DIAGNOSIS — F4311 Post-traumatic stress disorder, acute: Secondary | ICD-10-CM | POA: Diagnosis not present

## 2021-12-07 DIAGNOSIS — Z01818 Encounter for other preprocedural examination: Secondary | ICD-10-CM

## 2021-12-07 DIAGNOSIS — F900 Attention-deficit hyperactivity disorder, predominantly inattentive type: Secondary | ICD-10-CM | POA: Diagnosis not present

## 2021-12-07 DIAGNOSIS — F332 Major depressive disorder, recurrent severe without psychotic features: Secondary | ICD-10-CM | POA: Diagnosis not present

## 2021-12-10 ENCOUNTER — Other Ambulatory Visit (HOSPITAL_COMMUNITY): Payer: Self-pay

## 2021-12-10 NOTE — Progress Notes (Addendum)
PCP - Irena Reichmann MD Cardiologist - no  PPM/ICD -  Device Orders -  Rep Notified -   Chest x-ray - 07-14-21 epic EKG - 07-14-21 epic Stress Test -  ECHO -  Cardiac Cath -   Sleep Study - results 11-08-21 epic CPAP -  NO  Fasting Blood Sugar -  Checks Blood Sugar _____ times a day  Blood Thinner Instructions: Aspirin Instructions:  ERAS Protcol - PRE-SURGERY Ensure or G2-   COVID TEST- 12-17-21 COVID vaccine -x3    Activity--Able to walk a flight of stairs without SOB Anesthesia review: OSA  NO CPAP  Patient denies shortness of breath, fever, cough and chest pain at PAT appointment   All instructions explained to the patient, with a verbal understanding of the material. Patient agrees to go over the instructions while at home for a better understanding. Patient also instructed to self quarantine after being tested for COVID-19. The opportunity to ask questions was provided.

## 2021-12-10 NOTE — Patient Instructions (Addendum)
DUE TO COVID-19 ONLY ONE VISITOR IS ALLOWED TO COME WITH YOU AND STAY IN THE WAITING ROOM ONLY DURING PRE OP AND PROCEDURE DAY OF SURGERY.   Up to two visitors ages 16+ are allowed at one time in a patient's room.  The visitors may rotate out with other people throughout the day.  Additionally, up to two children between the ages of 40 and 43 are allowed and do not count toward the number of allowed visitors.  Children within this age range must be accompanied by an adult visitor.  One adult visitor may remain with the patient overnight and must be in the room by 8 PM.  YOU NEED TO HAVE A COVID 19 TEST ON__2-17-23 @   _____ THIS TEST MUST BE DONE BEFORE SURGERY,     COVID TESTING SITE    [facility] COME IN THROUGH MAIN ENTRANCE BE SEATED INT THE LOBBY AREA TO THE RIGHT AS YOU COME IN THE MAIN ENTRANCE DIAL [phone] GIVE THEM YOUR NAME AND LET THEM KNOW YOU ARE HERE FOR COVID TESTING    ONCE YOUR COVID TEST IS COMPLETED,  PLEASE Wear a mask when in public AND PRACTICE GOOD HAND HYGIENE           Your procedure is scheduled on: 12-21-21   Report to [facility] Main  Entrance   Report to admitting at         0515 AM     Call this number if you have problems the morning of surgery [phone]   Remember: Follow diet per MD order you may have clear liquids until 0430 am then nothing by mouth    CLEAR LIQUID DIET                                                                    water Black Coffee and tea, regular and decaf No Creamer                            Plain Jell-O any favor except red or purple                                  Fruit ices (not with fruit pulp)                                      Iced Popsicles                                                                      Cranberry, grape and apple juices Sports drinks like Gatorade Lightly seasoned clear broth or consume(fat free) Sugar, honey syrup  Sample Menu Breakfast  Lunch                                     Supper Cranberry juice                    Beef broth                            Chicken broth Jell-O                                     Grape juice                           Apple juice Coffee or tea                        Jell-O                                      Popsicle                                                Coffee or tea                        Coffee or tea  _____________________________________________________________________      BRUSH YOUR TEETH MORNING OF SURGERY AND RINSE YOUR MOUTH OUT, NO CHEWING GUM CANDY OR MINTS.     Take these medicines the morning of surgery with A SIP OF WATER: mucinex  DO NOT TAKE ANY DIABETIC MEDICATIONS DAY OF YOUR SURGERY                               You may not have any metal on your body including hair pins and              piercings  Do not wear jewelry, lotions, powders,perfumes,        deodorant                       Men may shave face and neck.   Do not bring valuables to the hospital. [facility] IS NOT             RESPONSIBLE   FOR VALUABLES.  Contacts, dentures or bridgework may not be worn into surgery.  You may bring a small overnight bag with you     Patients discharged the day of surgery will not be allowed to drive home. IF YOU ARE HAVING SURGERY AND GOING HOME THE SAME DAY, YOU MUST HAVE AN ADULT TO DRIVE YOU HOME AND BE WITH YOU FOR 24 HOURS. YOU MAY GO HOME BY TAXI OR UBER OR ORTHERWISE, BUT AN ADULT MUST ACCOMPANY YOU HOME AND STAY WITH YOU FOR 24 HOURS.  Name and phone number of your driver:  Special Instructions: N/A              Please read over the following fact  sheets you were given: _____________________________________________________________________             [facility] - Preparing for Surgery Before surgery, you can play an important role.  Because skin is not sterile, your skin needs to be as free of germs as possible.  You can reduce the number of  germs on your skin by washing with CHG (chlorahexidine gluconate) soap before surgery.  CHG is an antiseptic cleaner which kills germs and bonds with the skin to continue killing germs even after washing. Please DO NOT use if you have an allergy to CHG or antibacterial soaps.  If your skin becomes reddened/irritated stop using the CHG and inform your nurse when you arrive at Short Stay. Do not shave (including legs and underarms) for at least 48 hours prior to the first CHG shower.  You may shave your face/neck. Please follow these instructions carefully:  1.  Shower with CHG Soap the night before surgery and the  morning of Surgery.  2.  If you choose to wash your hair, wash your hair first as usual with your  normal  shampoo.  3.  After you shampoo, rinse your hair and body thoroughly to remove the  shampoo.                           4.  Use CHG as you would any other liquid soap.  You can apply chg directly  to the skin and wash                       Gently with a scrungie or clean washcloth.  5.  Apply the CHG Soap to your body ONLY FROM THE NECK DOWN.   Do not use on face/ open                           Wound or open sores. Avoid contact with eyes, ears mouth and genitals (private parts).                       Wash face,  Genitals (private parts) with your normal soap.             6.  Wash thoroughly, paying special attention to the area where your surgery  will be performed.  7.  Thoroughly rinse your body with warm water from the neck down.  8.  DO NOT shower/wash with your normal soap after using and rinsing off  the CHG Soap.                9.  Pat yourself dry with a clean towel.            10.  Wear clean pajamas.            11.  Place clean sheets on your bed the night of your first shower and do not  sleep with pets. Day of Surgery : Do not apply any lotions/deodorants the morning of surgery.  Please wear clean clothes to the hospital/surgery center.  FAILURE TO FOLLOW THESE  INSTRUCTIONS MAY RESULT IN THE CANCELLATION OF YOUR SURGERY PATIENT SIGNATURE_________________________________  NURSE SIGNATURE__________________________________  ________________________________________________________________________

## 2021-12-13 ENCOUNTER — Encounter (HOSPITAL_COMMUNITY)
Admission: RE | Admit: 2021-12-13 | Discharge: 2021-12-13 | Disposition: A | Discharge status: Home / Self Care | Payer: BC Managed Care – PPO | Source: Ambulatory Visit | Attending: General Surgery | Admitting: General Surgery

## 2021-12-13 ENCOUNTER — Other Ambulatory Visit: Payer: Self-pay

## 2021-12-13 ENCOUNTER — Encounter (HOSPITAL_COMMUNITY): Payer: Self-pay

## 2021-12-13 VITALS — BP 167/83 | HR 71 | Temp 98.1°F | Resp 16 | Ht 72.0 in | Wt >= 6400 oz

## 2021-12-13 DIAGNOSIS — R7303 Prediabetes: Secondary | ICD-10-CM | POA: Diagnosis not present

## 2021-12-13 DIAGNOSIS — Z01812 Encounter for preprocedural laboratory examination: Secondary | ICD-10-CM | POA: Diagnosis not present

## 2021-12-13 DIAGNOSIS — Z01818 Encounter for other preprocedural examination: Secondary | ICD-10-CM

## 2021-12-13 HISTORY — DX: Pneumonia, unspecified organism: J18.9

## 2021-12-13 HISTORY — DX: Carpal tunnel syndrome, unspecified upper limb: G56.00

## 2021-12-13 HISTORY — DX: Sleep apnea, unspecified: G47.30

## 2021-12-13 HISTORY — DX: Unspecified asthma, uncomplicated: J45.909

## 2021-12-13 HISTORY — DX: Prediabetes: R73.03

## 2021-12-13 LAB — HEMOGLOBIN A1C
Hgb A1c MFr Bld: 5.4 % (ref 4.8–5.6)
Mean Plasma Glucose: 108.28 mg/dL

## 2021-12-13 LAB — CBC
HCT: 45.6 % (ref 39.0–52.0)
Hemoglobin: 15.2 g/dL (ref 13.0–17.0)
MCH: 30.8 pg (ref 26.0–34.0)
MCHC: 33.3 g/dL (ref 30.0–36.0)
MCV: 92.3 fL (ref 80.0–100.0)
Platelets: 232 10*3/uL (ref 150–400)
RBC: 4.94 MIL/uL (ref 4.22–5.81)
RDW: 12.8 % (ref 11.5–15.5)
WBC: 7.9 10*3/uL (ref 4.0–10.5)
nRBC: 0 % (ref 0.0–0.2)

## 2021-12-13 LAB — GLUCOSE, CAPILLARY: Glucose-Capillary: 99 mg/dL (ref 70–99)

## 2021-12-13 NOTE — Progress Notes (Signed)
Spoke with Answering service at [facility] to request orders for preop.

## 2021-12-20 DIAGNOSIS — F4311 Post-traumatic stress disorder, acute: Secondary | ICD-10-CM | POA: Diagnosis not present

## 2021-12-20 DIAGNOSIS — F900 Attention-deficit hyperactivity disorder, predominantly inattentive type: Secondary | ICD-10-CM | POA: Diagnosis not present

## 2021-12-20 DIAGNOSIS — F332 Major depressive disorder, recurrent severe without psychotic features: Secondary | ICD-10-CM | POA: Diagnosis not present

## 2021-12-20 NOTE — Anesthesia Preprocedure Evaluation (Addendum)
Anesthesia Evaluation  Patient identified by MRN, date of birth, ID band Patient awake    Reviewed: Allergy & Precautions, H&P , NPO status , Patient's Chart, lab work & pertinent test results  Airway Mallampati: III  TM Distance: >3 FB Neck ROM: Full    Dental no notable dental hx. (+) Teeth Intact, Dental Advisory Given   Pulmonary asthma , sleep apnea and Continuous Positive Airway Pressure Ventilation , Current Smoker,    Pulmonary exam normal breath sounds clear to auscultation       Cardiovascular Exercise Tolerance: Good negative cardio ROS   Rhythm:Regular Rate:Normal     Neuro/Psych negative neurological ROS  negative psych ROS   GI/Hepatic negative GI ROS, Neg liver ROS,   Endo/Other  Morbid obesity  Renal/GU negative Renal ROS  negative genitourinary   Musculoskeletal   Abdominal   Peds  Hematology negative hematology ROS (+)   Anesthesia Other Findings   Reproductive/Obstetrics negative OB ROS                            Anesthesia Physical Anesthesia Plan  ASA: 3  Anesthesia Plan: General   Post-op Pain Management: Tylenol PO (pre-op)* and Toradol IV (intra-op)*   Induction: Intravenous  PONV Risk Score and Plan: 2 and Ondansetron, Dexamethasone and Midazolam  Airway Management Planned: Oral ETT  Additional Equipment:   Intra-op Plan:   Post-operative Plan: Extubation in OR  Informed Consent: I have reviewed the patients History and Physical, chart, labs and discussed the procedure including the risks, benefits and alternatives for the proposed anesthesia with the patient or authorized representative who has indicated his/her understanding and acceptance.     Dental advisory given  Plan Discussed with: CRNA  Anesthesia Plan Comments:        Anesthesia Quick Evaluation

## 2021-12-21 ENCOUNTER — Inpatient Hospital Stay (HOSPITAL_COMMUNITY)
Admission: RE | Admit: 2021-12-21 | Discharge: 2021-12-22 | DRG: 621 | Disposition: A | Discharge status: Home / Self Care | Payer: BC Managed Care – PPO | Source: Ambulatory Visit | Attending: General Surgery | Admitting: General Surgery

## 2021-12-21 ENCOUNTER — Inpatient Hospital Stay (HOSPITAL_COMMUNITY): Payer: BC Managed Care – PPO | Admitting: Registered Nurse

## 2021-12-21 ENCOUNTER — Encounter (HOSPITAL_COMMUNITY)
Admission: RE | Disposition: A | Discharge status: Home / Self Care | Payer: Self-pay | Source: Ambulatory Visit | Attending: General Surgery

## 2021-12-21 ENCOUNTER — Other Ambulatory Visit: Payer: Self-pay

## 2021-12-21 ENCOUNTER — Encounter (HOSPITAL_COMMUNITY): Payer: Self-pay | Admitting: General Surgery

## 2021-12-21 DIAGNOSIS — Z6841 Body Mass Index (BMI) 40.0 and over, adult: Secondary | ICD-10-CM

## 2021-12-21 DIAGNOSIS — Z9884 Bariatric surgery status: Secondary | ICD-10-CM

## 2021-12-21 DIAGNOSIS — Z20822 Contact with and (suspected) exposure to covid-19: Secondary | ICD-10-CM | POA: Diagnosis not present

## 2021-12-21 DIAGNOSIS — I83893 Varicose veins of bilateral lower extremities with other complications: Secondary | ICD-10-CM | POA: Diagnosis not present

## 2021-12-21 DIAGNOSIS — F17290 Nicotine dependence, other tobacco product, uncomplicated: Secondary | ICD-10-CM | POA: Diagnosis present

## 2021-12-21 DIAGNOSIS — G8929 Other chronic pain: Secondary | ICD-10-CM | POA: Diagnosis present

## 2021-12-21 DIAGNOSIS — R030 Elevated blood-pressure reading, without diagnosis of hypertension: Secondary | ICD-10-CM | POA: Diagnosis present

## 2021-12-21 DIAGNOSIS — G4733 Obstructive sleep apnea (adult) (pediatric): Secondary | ICD-10-CM | POA: Diagnosis present

## 2021-12-21 DIAGNOSIS — K449 Diaphragmatic hernia without obstruction or gangrene: Secondary | ICD-10-CM | POA: Diagnosis present

## 2021-12-21 DIAGNOSIS — M25562 Pain in left knee: Secondary | ICD-10-CM | POA: Diagnosis present

## 2021-12-21 DIAGNOSIS — M5431 Sciatica, right side: Secondary | ICD-10-CM | POA: Diagnosis not present

## 2021-12-21 DIAGNOSIS — M25561 Pain in right knee: Secondary | ICD-10-CM | POA: Diagnosis present

## 2021-12-21 DIAGNOSIS — K222 Esophageal obstruction: Secondary | ICD-10-CM | POA: Diagnosis not present

## 2021-12-21 DIAGNOSIS — E781 Pure hyperglyceridemia: Secondary | ICD-10-CM | POA: Diagnosis present

## 2021-12-21 DIAGNOSIS — Z882 Allergy status to sulfonamides status: Secondary | ICD-10-CM | POA: Diagnosis not present

## 2021-12-21 DIAGNOSIS — R7303 Prediabetes: Secondary | ICD-10-CM | POA: Diagnosis present

## 2021-12-21 DIAGNOSIS — E7800 Pure hypercholesterolemia, unspecified: Secondary | ICD-10-CM | POA: Diagnosis present

## 2021-12-21 DIAGNOSIS — Z79899 Other long term (current) drug therapy: Secondary | ICD-10-CM | POA: Diagnosis not present

## 2021-12-21 DIAGNOSIS — E661 Drug-induced obesity: Secondary | ICD-10-CM | POA: Diagnosis not present

## 2021-12-21 HISTORY — PX: UPPER GI ENDOSCOPY: SHX6162

## 2021-12-21 HISTORY — PX: LAPAROSCOPIC GASTRIC SLEEVE RESECTION: SHX5895

## 2021-12-21 HISTORY — PX: HIATAL HERNIA REPAIR: SHX195

## 2021-12-21 LAB — HEMOGLOBIN AND HEMATOCRIT, BLOOD
HCT: 45.1 % (ref 39.0–52.0)
Hemoglobin: 14.9 g/dL (ref 13.0–17.0)

## 2021-12-21 LAB — SARS CORONAVIRUS 2 BY RT PCR (HOSPITAL ORDER, PERFORMED IN ~~LOC~~ HOSPITAL LAB): SARS Coronavirus 2: NEGATIVE

## 2021-12-21 SURGERY — GASTRECTOMY, SLEEVE, LAPAROSCOPIC
Anesthesia: General

## 2021-12-21 MED ORDER — BUPIVACAINE LIPOSOME 1.3 % IJ SUSP
INTRAMUSCULAR | Status: DC | PRN
  Administered 2021-12-21: 20 mL

## 2021-12-21 MED ORDER — ORAL CARE MOUTH RINSE
15.0000 mL | Freq: Once | OROMUCOSAL | Status: AC
  Administered 2021-12-21: 15 mL via OROMUCOSAL

## 2021-12-21 MED ORDER — FENTANYL CITRATE (PF) 100 MCG/2ML IJ SOLN
INTRAMUSCULAR | Status: DC | PRN
Start: 2021-12-21 — End: 2021-12-21
  Administered 2021-12-21 (×2): 50 ug via INTRAVENOUS

## 2021-12-21 MED ORDER — ROCURONIUM BROMIDE 10 MG/ML (PF) SYRINGE
PREFILLED_SYRINGE | INTRAVENOUS | Status: AC
  Filled 2021-12-21: qty 10

## 2021-12-21 MED ORDER — ONDANSETRON HCL 4 MG/2ML IJ SOLN
INTRAMUSCULAR | Status: DC | PRN
  Administered 2021-12-21: 4 mg via INTRAVENOUS

## 2021-12-21 MED ORDER — SCOPOLAMINE 1 MG/3DAYS TD PT72
1.0000 | MEDICATED_PATCH | TRANSDERMAL | Status: DC
  Administered 2021-12-21: 1.5 mg via TRANSDERMAL
  Filled 2021-12-21: qty 1

## 2021-12-21 MED ORDER — KETOROLAC TROMETHAMINE 30 MG/ML IJ SOLN
INTRAMUSCULAR | Status: DC | PRN
  Administered 2021-12-21: 15 mg via INTRAVENOUS

## 2021-12-21 MED ORDER — LIDOCAINE HCL (PF) 2 % IJ SOLN
INTRAMUSCULAR | Status: AC
  Filled 2021-12-21: qty 5

## 2021-12-21 MED ORDER — ENOXAPARIN SODIUM 30 MG/0.3ML IJ SOSY
30.0000 mg | PREFILLED_SYRINGE | Freq: Two times a day (BID) | INTRAMUSCULAR | Status: DC
  Administered 2021-12-21 – 2021-12-22 (×3): 30 mg via SUBCUTANEOUS
  Filled 2021-12-21 (×3): qty 0.3

## 2021-12-21 MED ORDER — MIDAZOLAM HCL 2 MG/2ML IJ SOLN
INTRAMUSCULAR | Status: AC
Start: 2021-12-21 — End: ?
  Filled 2021-12-21: qty 2

## 2021-12-21 MED ORDER — ACETAMINOPHEN 160 MG/5ML PO SOLN
1000.0000 mg | Freq: Three times a day (TID) | ORAL | Status: DC
  Administered 2021-12-21 – 2021-12-22 (×2): 1000 mg via ORAL
  Filled 2021-12-21 (×2): qty 40.6

## 2021-12-21 MED ORDER — FENTANYL CITRATE (PF) 100 MCG/2ML IJ SOLN
INTRAMUSCULAR | Status: AC
  Filled 2021-12-21: qty 2

## 2021-12-21 MED ORDER — PHENYLEPHRINE HCL-NACL 20-0.9 MG/250ML-% IV SOLN
INTRAVENOUS | Status: AC
  Filled 2021-12-21: qty 250

## 2021-12-21 MED ORDER — HEPARIN SODIUM (PORCINE) 5000 UNIT/ML IJ SOLN
INTRAMUSCULAR | Status: AC
  Filled 2021-12-21: qty 1

## 2021-12-21 MED ORDER — PHENYLEPHRINE HCL-NACL 20-0.9 MG/250ML-% IV SOLN
INTRAVENOUS | Status: DC | PRN
  Administered 2021-12-21: 25 ug/min via INTRAVENOUS

## 2021-12-21 MED ORDER — SODIUM CHLORIDE (PF) 0.9 % IJ SOLN
INTRAMUSCULAR | Status: DC | PRN
  Administered 2021-12-21: 50 mL via INTRAVENOUS

## 2021-12-21 MED ORDER — CHLORHEXIDINE GLUCONATE 0.12 % MT SOLN: 15.0000 mL | Freq: Once | OROMUCOSAL | Status: AC

## 2021-12-21 MED ORDER — SODIUM CHLORIDE 0.9 % IV SOLN
2.0000 g | Freq: Once | INTRAVENOUS | Status: AC
  Administered 2021-12-21: 2 g via INTRAVENOUS
  Filled 2021-12-21: qty 2

## 2021-12-21 MED ORDER — DIPHENHYDRAMINE HCL 50 MG/ML IJ SOLN: 12.5000 mg | Freq: Three times a day (TID) | INTRAMUSCULAR | Status: DC | PRN

## 2021-12-21 MED ORDER — ENSURE MAX PROTEIN PO LIQD: 2.0000 [oz_av] | ORAL | Status: DC

## 2021-12-21 MED ORDER — SODIUM CHLORIDE 0.9 % IV SOLN
12.5000 mg | Freq: Four times a day (QID) | INTRAVENOUS | Status: DC | PRN
  Filled 2021-12-21: qty 0.5

## 2021-12-21 MED ORDER — ONDANSETRON HCL 4 MG/2ML IJ SOLN: 4.0000 mg | Freq: Four times a day (QID) | INTRAMUSCULAR | Status: DC | PRN

## 2021-12-21 MED ORDER — SUGAMMADEX SODIUM 500 MG/5ML IV SOLN
INTRAVENOUS | Status: DC | PRN
  Administered 2021-12-21: 500 mg via INTRAVENOUS

## 2021-12-21 MED ORDER — APREPITANT 40 MG PO CAPS
40.0000 mg | ORAL_CAPSULE | Freq: Once | ORAL | Status: AC
  Administered 2021-12-21: 40 mg via ORAL
  Filled 2021-12-21: qty 1

## 2021-12-21 MED ORDER — HYDRALAZINE HCL 20 MG/ML IJ SOLN: 10.0000 mg | INTRAMUSCULAR | Status: DC | PRN

## 2021-12-21 MED ORDER — ONDANSETRON HCL 4 MG/2ML IJ SOLN
INTRAMUSCULAR | Status: AC
  Filled 2021-12-21: qty 2

## 2021-12-21 MED ORDER — PROPOFOL 10 MG/ML IV BOLUS
INTRAVENOUS | Status: AC
  Filled 2021-12-21: qty 20

## 2021-12-21 MED ORDER — BUPIVACAINE LIPOSOME 1.3 % IJ SUSP
INTRAMUSCULAR | Status: AC
  Filled 2021-12-21: qty 20

## 2021-12-21 MED ORDER — ROCURONIUM BROMIDE 10 MG/ML (PF) SYRINGE
PREFILLED_SYRINGE | INTRAVENOUS | Status: DC | PRN
  Administered 2021-12-21 (×2): 20 mg via INTRAVENOUS
  Administered 2021-12-21: 80 mg via INTRAVENOUS
  Administered 2021-12-21: 20 mg via INTRAVENOUS

## 2021-12-21 MED ORDER — LIDOCAINE 2% (20 MG/ML) 5 ML SYRINGE
INTRAMUSCULAR | Status: DC | PRN
Start: 2021-12-21 — End: 2021-12-21
  Administered 2021-12-21: 100 mg via INTRAVENOUS

## 2021-12-21 MED ORDER — MORPHINE SULFATE (PF) 2 MG/ML IV SOLN
1.0000 mg | INTRAVENOUS | Status: DC | PRN
  Administered 2021-12-22: 2 mg via INTRAVENOUS
  Filled 2021-12-21: qty 1

## 2021-12-21 MED ORDER — KCL IN DEXTROSE-NACL 20-5-0.45 MEQ/L-%-% IV SOLN
INTRAVENOUS | Status: DC
  Filled 2021-12-21 (×3): qty 1000

## 2021-12-21 MED ORDER — KETOROLAC TROMETHAMINE 30 MG/ML IJ SOLN
INTRAMUSCULAR | Status: AC
  Filled 2021-12-21: qty 1

## 2021-12-21 MED ORDER — SUGAMMADEX SODIUM 500 MG/5ML IV SOLN
INTRAVENOUS | Status: AC
  Filled 2021-12-21: qty 5

## 2021-12-21 MED ORDER — HYDROMORPHONE HCL 1 MG/ML IJ SOLN: 0.2500 mg | INTRAMUSCULAR | Status: DC | PRN

## 2021-12-21 MED ORDER — PANTOPRAZOLE SODIUM 40 MG IV SOLR
40.0000 mg | Freq: Every day | INTRAVENOUS | Status: DC
  Administered 2021-12-21: 40 mg via INTRAVENOUS
  Filled 2021-12-21: qty 10

## 2021-12-21 MED ORDER — SUCCINYLCHOLINE CHLORIDE 200 MG/10ML IV SOSY
PREFILLED_SYRINGE | INTRAVENOUS | Status: AC
  Filled 2021-12-21: qty 10

## 2021-12-21 MED ORDER — ACETAMINOPHEN 500 MG PO TABS
1000.0000 mg | ORAL_TABLET | Freq: Once | ORAL | Status: AC
  Administered 2021-12-21: 1000 mg via ORAL
  Filled 2021-12-21: qty 2

## 2021-12-21 MED ORDER — DEXAMETHASONE SODIUM PHOSPHATE 10 MG/ML IJ SOLN
INTRAMUSCULAR | Status: DC | PRN
Start: 2021-12-21 — End: 2021-12-21
  Administered 2021-12-21: 8 mg via INTRAVENOUS

## 2021-12-21 MED ORDER — SUCCINYLCHOLINE CHLORIDE 200 MG/10ML IV SOSY
PREFILLED_SYRINGE | INTRAVENOUS | Status: DC | PRN
  Administered 2021-12-21: 140 mg via INTRAVENOUS

## 2021-12-21 MED ORDER — HEPARIN SODIUM (PORCINE) 5000 UNIT/ML IJ SOLN: 5000.0000 [IU] | Freq: Once | INTRAMUSCULAR | Status: DC

## 2021-12-21 MED ORDER — ENOXAPARIN (LOVENOX) PATIENT EDUCATION KIT
PACK | Freq: Once | Status: AC
  Filled 2021-12-21: qty 1

## 2021-12-21 MED ORDER — EPHEDRINE 5 MG/ML INJ
INTRAVENOUS | Status: AC
  Filled 2021-12-21: qty 5

## 2021-12-21 MED ORDER — HEPARIN SODIUM (PORCINE) 5000 UNIT/ML IJ SOLN
5000.0000 [IU] | Freq: Once | INTRAMUSCULAR | Status: AC
  Administered 2021-12-21: 5000 [IU] via SUBCUTANEOUS

## 2021-12-21 MED ORDER — ACETAMINOPHEN 500 MG PO TABS
1000.0000 mg | ORAL_TABLET | Freq: Three times a day (TID) | ORAL | Status: DC
  Administered 2021-12-21: 1000 mg via ORAL
  Filled 2021-12-21: qty 2

## 2021-12-21 MED ORDER — LACTATED RINGERS IR SOLN
Status: DC | PRN
  Administered 2021-12-21: 1000 mL

## 2021-12-21 MED ORDER — OXYCODONE HCL 5 MG/5ML PO SOLN
5.0000 mg | Freq: Four times a day (QID) | ORAL | Status: DC | PRN
  Administered 2021-12-21 – 2021-12-22 (×4): 5 mg via ORAL
  Filled 2021-12-21 (×4): qty 5

## 2021-12-21 MED ORDER — PROPOFOL 10 MG/ML IV BOLUS
INTRAVENOUS | Status: DC | PRN
  Administered 2021-12-21: 250 mg via INTRAVENOUS

## 2021-12-21 MED ORDER — EPHEDRINE SULFATE-NACL 50-0.9 MG/10ML-% IV SOSY
PREFILLED_SYRINGE | INTRAVENOUS | Status: DC | PRN
  Administered 2021-12-21 (×2): 5 mg via INTRAVENOUS

## 2021-12-21 MED ORDER — MIDAZOLAM HCL 5 MG/5ML IJ SOLN
INTRAMUSCULAR | Status: DC | PRN
  Administered 2021-12-21: 2 mg via INTRAVENOUS

## 2021-12-21 MED ORDER — DEXAMETHASONE SODIUM PHOSPHATE 10 MG/ML IJ SOLN
INTRAMUSCULAR | Status: AC
  Filled 2021-12-21: qty 1

## 2021-12-21 MED ORDER — SIMETHICONE 80 MG PO CHEW
80.0000 mg | CHEWABLE_TABLET | Freq: Four times a day (QID) | ORAL | Status: DC | PRN
  Administered 2021-12-21 – 2021-12-22 (×3): 80 mg via ORAL
  Filled 2021-12-21 (×5): qty 1

## 2021-12-21 MED ORDER — LACTATED RINGERS IV SOLN: INTRAVENOUS | Status: DC

## 2021-12-21 SURGICAL SUPPLY — 114 items
APL PRP STRL LF DISP 70% ISPRP (MISCELLANEOUS) ×2
APL SKNCLS STERI-STRIP NONHPOA (GAUZE/BANDAGES/DRESSINGS) ×1
APL SRG 32X5 SNPLK LF DISP (MISCELLANEOUS)
APL SWBSTK 6 STRL LF DISP (MISCELLANEOUS) ×1
APPLICATOR COTTON TIP 6 STRL (MISCELLANEOUS) ×1 IMPLANT
APPLICATOR COTTON TIP 6IN STRL (MISCELLANEOUS) ×2
APPLIER CLIP ROT 10 11.4 M/L (STAPLE)
APPLIER CLIP ROT 13.4 12 LRG (CLIP)
APR CLP LRG 13.4X12 ROT 20 MLT (CLIP)
APR CLP MED LRG 11.4X10 (STAPLE)
BAG COUNTER SPONGE SURGICOUNT (BAG) IMPLANT
BAG SPNG CNTER NS LX DISP (BAG)
BENZOIN TINCTURE PRP APPL 2/3 (GAUZE/BANDAGES/DRESSINGS) ×2 IMPLANT
BLADE EXTENDED COATED 6.5IN (ELECTRODE) IMPLANT
BLADE HEX COATED 2.75 (ELECTRODE) ×2 IMPLANT
BLADE SURG SZ11 CARB STEEL (BLADE) ×2 IMPLANT
BNDG ADH 1X3 SHEER STRL LF (GAUZE/BANDAGES/DRESSINGS) ×12 IMPLANT
BNDG ADH THN 3X1 STRL LF (GAUZE/BANDAGES/DRESSINGS) ×6
CABLE HIGH FREQUENCY MONO STRZ (ELECTRODE) ×2 IMPLANT
CHLORAPREP W/TINT 26 (MISCELLANEOUS) ×4 IMPLANT
CLIP APPLIE ROT 10 11.4 M/L (STAPLE) IMPLANT
CLIP APPLIE ROT 13.4 12 LRG (CLIP) IMPLANT
CLSR STERI-STRIP ANTIMIC 1/2X4 (GAUZE/BANDAGES/DRESSINGS) ×1 IMPLANT
COVER MAYO STAND STRL (DRAPES) IMPLANT
COVER SURGICAL LIGHT HANDLE (MISCELLANEOUS) ×2 IMPLANT
DEVICE SUT QUICK LOAD TK 5 (STAPLE) ×3 IMPLANT
DEVICE SUT TI-KNOT TK 5X26 (MISCELLANEOUS) ×1 IMPLANT
DEVICE SUTURE ENDOST 10MM (ENDOMECHANICALS) IMPLANT
DISSECTOR BLUNT TIP ENDO 5MM (MISCELLANEOUS) IMPLANT
DRAIN PENROSE 0.5X18 (DRAIN) ×2 IMPLANT
DRAPE LAPAROSCOPIC ABDOMINAL (DRAPES) ×2 IMPLANT
DRAPE UTILITY XL STRL (DRAPES) ×4 IMPLANT
DRAPE WARM FLUID 44X44 (DRAPES) IMPLANT
DRSG IV TEGADERM 3.5X4.5 STRL (GAUZE/BANDAGES/DRESSINGS) ×1 IMPLANT
ELECT L-HOOK LAP 45CM DISP (ELECTROSURGICAL)
ELECT REM PT RETURN 15FT ADLT (MISCELLANEOUS) ×2 IMPLANT
ELECTRODE L-HOOK LAP 45CM DISP (ELECTROSURGICAL) IMPLANT
GAUZE SPONGE 2X2 8PLY STRL LF (GAUZE/BANDAGES/DRESSINGS) IMPLANT
GAUZE SPONGE 4X4 12PLY STRL (GAUZE/BANDAGES/DRESSINGS) ×2 IMPLANT
GLOVE SRG 8 PF TXTR STRL LF DI (GLOVE) ×2 IMPLANT
GLOVE SURG ENC TEXT LTX SZ7.5 (GLOVE) ×2 IMPLANT
GLOVE SURG NEOPR MICRO LF SZ8 (GLOVE) ×2 IMPLANT
GLOVE SURG POLY ORTHO LF SZ7.5 (GLOVE) ×4 IMPLANT
GLOVE SURG UNDER POLY LF SZ7 (GLOVE) ×2 IMPLANT
GLOVE SURG UNDER POLY LF SZ8 (GLOVE) ×4
GOWN STRL REUS W/TWL LRG LVL3 (GOWN DISPOSABLE) ×2 IMPLANT
GOWN STRL REUS W/TWL XL LVL3 (GOWN DISPOSABLE) ×6 IMPLANT
GRASPER SUT TROCAR 14GX15 (MISCELLANEOUS) ×2 IMPLANT
HANDLE SUCTION POOLE (INSTRUMENTS) IMPLANT
IRRIG SUCT STRYKERFLOW 2 WTIP (MISCELLANEOUS) ×2
IRRIGATION SUCT STRKRFLW 2 WTP (MISCELLANEOUS) ×1 IMPLANT
KIT BASIN OR (CUSTOM PROCEDURE TRAY) ×2 IMPLANT
KIT TURNOVER KIT A (KITS) IMPLANT
MARKER SKIN DUAL TIP RULER LAB (MISCELLANEOUS) ×2 IMPLANT
MAT PREVALON FULL STRYKER (MISCELLANEOUS) ×2 IMPLANT
NDL SPNL 22GX3.5 QUINCKE BK (NEEDLE) ×1 IMPLANT
NEEDLE SPNL 22GX3.5 QUINCKE BK (NEEDLE) ×2 IMPLANT
NS IRRIG 1000ML POUR BTL (IV SOLUTION) ×2 IMPLANT
PACK GENERAL/GYN (CUSTOM PROCEDURE TRAY) ×2 IMPLANT
PACK UNIVERSAL I (CUSTOM PROCEDURE TRAY) ×2 IMPLANT
PENCIL SMOKE EVACUATOR (MISCELLANEOUS) IMPLANT
RELOAD STAPLE 60 3.6 BLU REG (STAPLE) ×1 IMPLANT
RELOAD STAPLE 60 3.8 GOLD REG (STAPLE) IMPLANT
RELOAD STAPLE 60 4.1 GRN THCK (STAPLE) ×1 IMPLANT
RELOAD STAPLE 60 BLK VRY/THCK (STAPLE) IMPLANT
RELOAD STAPLER 60MM BLK (STAPLE) IMPLANT
RELOAD STAPLER BLUE 60MM (STAPLE) ×5 IMPLANT
RELOAD STAPLER GOLD 60MM (STAPLE) ×1 IMPLANT
RELOAD STAPLER GREEN 60MM (STAPLE) ×2 IMPLANT
SCISSORS LAP 5X45 EPIX DISP (ENDOMECHANICALS) IMPLANT
SEALANT SURGICAL APPL DUAL CAN (MISCELLANEOUS) IMPLANT
SET TUBE SMOKE EVAC HIGH FLOW (TUBING) ×2 IMPLANT
SHEARS HARMONIC ACE PLUS 45CM (MISCELLANEOUS) ×2 IMPLANT
SLEEVE GASTRECTOMY 40FR VISIGI (MISCELLANEOUS) ×2 IMPLANT
SLEEVE XCEL OPT CAN 5 100 (ENDOMECHANICALS) ×6 IMPLANT
SOL ANTI FOG 6CC (MISCELLANEOUS) ×1 IMPLANT
SOLUTION ANTI FOG 6CC (MISCELLANEOUS) ×1
SPIKE FLUID TRANSFER (MISCELLANEOUS) ×2 IMPLANT
SPONGE GAUZE 2X2 STER 10/PKG (GAUZE/BANDAGES/DRESSINGS) ×1
SPONGE T-LAP 18X18 ~~LOC~~+RFID (SPONGE) ×2 IMPLANT
STAPLE LINE REINFORCEMENT LAP (STAPLE) ×6 IMPLANT
STAPLER ECHELON BIOABSB 60 FLE (MISCELLANEOUS) ×8 IMPLANT
STAPLER ECHELON LONG 3000 60 (ENDOMECHANICALS) ×1 IMPLANT
STAPLER ECHELON LONG 60 440 (INSTRUMENTS) ×2 IMPLANT
STAPLER RELOAD 60MM BLK (STAPLE)
STAPLER RELOAD BLUE 60MM (STAPLE) ×10
STAPLER RELOAD GOLD 60MM (STAPLE) ×2
STAPLER RELOAD GREEN 60MM (STAPLE) ×4
STAPLER VISISTAT 35W (STAPLE) ×2 IMPLANT
STRIP CLOSURE SKIN 1/2X4 (GAUZE/BANDAGES/DRESSINGS) ×2 IMPLANT
SUCTION POOLE HANDLE (INSTRUMENTS)
SUT MNCRL AB 4-0 PS2 18 (SUTURE) ×2 IMPLANT
SUT PDS AB 1 CTX 36 (SUTURE) IMPLANT
SUT SILK 2 0 (SUTURE)
SUT SILK 2 0 SH CR/8 (SUTURE) IMPLANT
SUT SILK 2-0 18XBRD TIE 12 (SUTURE) IMPLANT
SUT SILK 3 0 (SUTURE)
SUT SILK 3 0 SH CR/8 (SUTURE) IMPLANT
SUT SILK 3-0 18XBRD TIE 12 (SUTURE) IMPLANT
SUT SURGIDAC NAB ES-9 0 48 120 (SUTURE) ×2 IMPLANT
SUT VICRYL 0 TIES 12 18 (SUTURE) ×2 IMPLANT
SUT VICRYL 2 0 18  UND BR (SUTURE)
SUT VICRYL 2 0 18 UND BR (SUTURE) IMPLANT
SYR 20ML LL LF (SYRINGE) ×2 IMPLANT
SYR 50ML LL SCALE MARK (SYRINGE) ×2 IMPLANT
TOWEL OR 17X26 10 PK STRL BLUE (TOWEL DISPOSABLE) ×4 IMPLANT
TOWEL OR NON WOVEN STRL DISP B (DISPOSABLE) ×2 IMPLANT
TRAY FOLEY MTR SLVR 16FR STAT (SET/KITS/TRAYS/PACK) IMPLANT
TROCAR BLADELESS 15MM (ENDOMECHANICALS) ×2 IMPLANT
TROCAR BLADELESS OPT 5 100 (ENDOMECHANICALS) ×2 IMPLANT
TUBE CALIBRATION LAPBAND (TUBING) ×1 IMPLANT
TUBING CONNECTING 10 (TUBING) ×4 IMPLANT
TUBING ENDO SMARTCAP (MISCELLANEOUS) ×2 IMPLANT
YANKAUER SUCT BULB TIP NO VENT (SUCTIONS) IMPLANT

## 2021-12-21 NOTE — Anesthesia Postprocedure Evaluation (Signed)
Anesthesia Post Note  Patient: Eric Blackwell  Procedure(s) Performed: LAPAROSCOPIC SLEEVE GASTRECTOMY HERNIA REPAIR HIATAL UPPER GI ENDOSCOPY     Patient location during evaluation: PACU Anesthesia Type: General Level of consciousness: awake and alert Pain management: pain level controlled Vital Signs Assessment: post-procedure vital signs reviewed and stable Respiratory status: spontaneous breathing, nonlabored ventilation and respiratory function stable Cardiovascular status: blood pressure returned to baseline and stable Postop Assessment: no apparent nausea or vomiting Anesthetic complications: no   No notable events documented.  Last Vitals:  Vitals:   12/21/21 1000 12/21/21 1100  BP: 131/69 140/77  Pulse: 68 69  Resp: 13   Temp: (!) 36.4 C   SpO2: 94% 94%    Last Pain:  Vitals:   12/21/21 1100  TempSrc:   PainSc: 0-No pain                 Abbagayle Zaragoza,W. EDMOND

## 2021-12-21 NOTE — Transfer of Care (Signed)
Immediate Anesthesia Transfer of Care Note  Patient: Eric Blackwell  Procedure(s) Performed: LAPAROSCOPIC SLEEVE GASTRECTOMY HERNIA REPAIR HIATAL UPPER GI ENDOSCOPY  Patient Location: PACU  Anesthesia Type:General  Level of Consciousness: awake, alert , oriented and patient cooperative  Airway & Oxygen Therapy: Patient Spontanous Breathing and Patient connected to face mask oxygen  Post-op Assessment: Report given to RN, Post -op Vital signs reviewed and stable and Patient moving all extremities  Post vital signs: Reviewed and stable  Last Vitals:  Vitals Value Taken Time  BP 132/53 12/21/21 0937  Temp    Pulse 75 12/21/21 0939  Resp 16 12/21/21 0939  SpO2 96 % 12/21/21 0939  Vitals shown include unvalidated device data.  Last Pain:  Vitals:   12/21/21 0535  TempSrc: Oral         Complications: No notable events documented.

## 2021-12-21 NOTE — Anesthesia Procedure Notes (Signed)
Procedure Name: Intubation Date/Time: 12/21/2021 7:41 AM Performed by: Victoriano Lain, CRNA Pre-anesthesia Checklist: Patient identified, Emergency Drugs available, Suction available, Patient being monitored and Timeout performed Patient Re-evaluated:Patient Re-evaluated prior to induction Oxygen Delivery Method: Circle system utilized Preoxygenation: Pre-oxygenation with 100% oxygen Induction Type: IV induction, Rapid sequence and Cricoid Pressure applied Laryngoscope Size: Mac and 4 Grade View: Grade II Tube type: Oral Tube size: 7.5 mm Number of attempts: 1 Airway Equipment and Method: Stylet Placement Confirmation: ETT inserted through vocal cords under direct vision, positive ETCO2 and breath sounds checked- equal and bilateral Secured at: 23 cm Tube secured with: Tape Dental Injury: Teeth and Oropharynx as per pre-operative assessment

## 2021-12-21 NOTE — Op Note (Signed)
Preoperative diagnosis: laparoscopic sleeve gastrectomy  Postoperative diagnosis: Same   Procedure: Upper endoscopy   Surgeon: Salimah Martinovich, M.D.  Anesthesia: Gen.   Indications for procedure: This patient was undergoing a laparoscopic sleeve gastrectomy.   Description of procedure: The endoscopy was placed in the mouth and into the oropharynx and under endoscopic vision it was advanced to the esophagogastric junction.  The stomach was insufflated and no bleeding or bubbles were seen.  The GEJ was identified at 43 cm from the teeth.  No bleeding or leaks were detected. The scope was withdrawn without difficulty.    Eric Blackwell, M.D. General, Bariatric, & Minimally Invasive Surgery [facility]   

## 2021-12-21 NOTE — Progress Notes (Signed)

## 2021-12-21 NOTE — Discharge Instructions (Signed)

## 2021-12-21 NOTE — H&P (Signed)
CC: here for surgery  Requesting provider: n/a  HPI: Eric Blackwell is an 36 y.o. male who is here for lap sleeve gastrectomy poss HHR, denies changes since last seen in clinic.   Eric Blackwell is a 36 y.o. male who is seen today for follow-up regarding his severe obesity and obesity related complications. His comorbidities include prediabetes, hypercholesterolemia, hypertriglyceridemia, chronic bilateral knee pain, right sciatica, moderate obstructive sleep apnea, varicose veins.. I initially met him at the end of August 2022 to discuss bariatric surgery. He has completed nutritional and psychological evaluations and received clearance. He was referred to [facility] neurology for sleep apnea assessment. They were concerned that he also might have sleep apnea so he was arranged for home sleep test which has been performed. His AHI was 23.1. This was consistent with moderate obstructive sleep apnea. He also underwent EKG, chest x-ray and upper GI. EKG and chest x-ray were unremarkable. He has upper GI showed a small sliding hiatal hernia and mild distal esophageal ring.  Otherwise he denies any changes since I last saw him. He denies any trips to the emergency room or hospital. He denies any chest pain, chest pressure, shortness of breath  Past Medical History:  Diagnosis Date   ADHD    Asthma    As a child   Carpal tunnel syndrome    Pneumonia    Pre-diabetes    Sciatica    Sleep apnea     Past Surgical History:  Procedure Laterality Date   hemmhroidectomy     VARICOSE VEIN SURGERY      History reviewed. No pertinent family history.  Social:  reports that he has been smoking cigars. He has never used smokeless tobacco. He reports that he does not currently use alcohol. He reports that he does not currently use drugs after having used the following drugs: Marijuana.  Allergies:  Allergies  Allergen Reactions   Sulfa Antibiotics     Unknown reaction, childhood  allergy    Medications: I have reviewed the patient's current medications.   ROS - all of the below systems have been reviewed with the patient and positives are indicated with bold text General: chills, fever or night sweats Eyes: blurry vision or double vision ENT: epistaxis or sore throat Allergy/Immunology: itchy/watery eyes or nasal congestion Hematologic/Lymphatic: bleeding problems, blood clots or swollen lymph nodes Endocrine: temperature intolerance or unexpected weight changes Breast: new or changing breast lumps or nipple discharge Resp: cough, shortness of breath, or wheezing CV: chest pain or dyspnea on exertion GI: as per HPI GU: dysuria, trouble voiding, or hematuria MSK: joint pain or joint stiffness Neuro: TIA or stroke symptoms Derm: pruritus and skin lesion changes Psych: anxiety and depression  PE Blood pressure (!) 151/80, pulse 93, temperature 98.1 F (36.7 C), temperature source Oral, resp. rate 20, weight (!) 180.9 kg, SpO2 98 %. Constitutional: NAD; conversant; no deformities Eyes: Moist conjunctiva; no lid lag; anicteric; PERRL Neck: Trachea midline; no thyromegaly Lungs: Normal respiratory effort; no tactile fremitus CV: RRR; no palpable thrills; no pitting edema GI: Abd soft, nt; no palpable hepatosplenomegaly MSK: Normal gait; no clubbing/cyanosis Psychiatric: Appropriate affect; alert and oriented x3 Lymphatic: No palpable cervical or axillary lymphadenopathy Skin:no lesions  Results for orders placed or performed during the hospital encounter of 12/21/21 (from the past 48 hour(s))  SARS Coronavirus 2 by RT PCR (hospital order, performed in [place] hospital lab) Nasopharyngeal Nasopharyngeal Swab     Status: None   Collection Time: 12/21/21  5:28 AM   Specimen: Nasopharyngeal Swab  Result Value Ref Range   SARS Coronavirus 2 NEGATIVE NEGATIVE    Comment: (NOTE) SARS-CoV-2 target nucleic acids are NOT DETECTED.  The SARS-CoV-2 RNA is  generally detectable in upper and lower respiratory specimens during the acute phase of infection. The lowest concentration of SARS-CoV-2 viral copies this assay can detect is 250 copies / mL. A negative result does not preclude SARS-CoV-2 infection and should not be used as the sole basis for treatment or other patient management decisions.  A negative result may occur with improper specimen collection / handling, submission of specimen other than nasopharyngeal swab, presence of viral mutation(s) within the areas targeted by this assay, and inadequate number of viral copies (<250 copies / mL). A negative result must be combined with clinical observations, patient history, and epidemiological information.  Fact Sheet for Patients:   StrictlyIdeas.no  Fact Sheet for Healthcare Providers: BankingDealers.co.za  This test is not yet approved or  cleared by the [place] FDA and has been authorized for detection and/or diagnosis of SARS-CoV-2 by FDA under an Emergency Use Authorization (EUA).  This EUA will remain in effect (meaning this test can be used) for the duration of the COVID-19 declaration under Section 564(b)(1) of the Act, 21 U.S.C. section 360bbb-3(b)(1), unless the authorization is terminated or revoked sooner.  Performed at [facility], [place] [address] [place], [place] [address]     No results found.  Imaging: reviewed  A/P: Pearl Berlinger is an 36 y.o. male with  Severe obesity (CMS-HCC)  Pure hypercholesterolemia  Bilateral chronic knee pain  Sciatica of right side  Prediabetes  Elevated blood pressure reading without diagnosis of hypertension  Obstructive sleep apnea  Varicose veins of both legs with edema  Sliding hiatal hernia   ERAS To OR for lap SLG with poss sliding hiatal hernia  repair, upper endo Subcu heparin   All questions asked and answered  Leighton Ruff. Redmond Pulling,  MD, FACS General, Bariatric, & Minimally Invasive Surgery [facility], [place]

## 2021-12-21 NOTE — Op Note (Signed)
12/21/2021 Eric Blackwell April 16, 1986 [id number]   PRE-OPERATIVE DIAGNOSIS:   Severe obesity (BMI 54)  Pure hypercholesterolemia  Bilateral chronic knee pain  Sciatica of right side  Prediabetes  Elevated blood pressure reading without diagnosis of hypertension  Obstructive sleep apnea  Varicose veins of both legs with edema  Possible Sliding hiatal hernia   POST-OPERATIVE DIAGNOSIS:  same + sliding hiatal hernia  PROCEDURE:  Procedure(s): LAPAROSCOPIC SLEEVE GASTRECTOMY WITH SLIDING HIATAL HERNIA REPAIR UPPER GI ENDOSCOPY  SURGEON:  Surgeon(s): Atilano Ina, MD FACS FASMBS  ASSISTANTS: Feliciana Rossetti MD FACS  ANESTHESIA:   general  DRAINS: none   BOUGIE: 40 fr ViSiGi  LOCAL MEDICATIONS USED:   Exparel  EBL: minimal  SPECIMEN:  Source of Specimen:  Greater curvature of stomach  DISPOSITION OF SPECIMEN:  PATHOLOGY  COUNTS:  YES  INDICATION FOR PROCEDURE: This is a very pleasant 36 y.o.-year-old morbidly obese male who has had unsuccessful attempts for sustained weight loss. The patient presents today for a planned laparoscopic sleeve gastrectomy with upper endoscopy. We have discussed the risk and benefits of the procedure extensively preoperatively. Please see my separate notes.  PROCEDURE: After obtaining informed consent and receiving 5000 units of subcutaneous heparin, the patient was brought to the operating room at [facility] and placed supine on the operating room table. General endotracheal anesthesia was established. Sequential compression devices were placed. A orogastric tube was placed. The patient's abdomen was prepped and draped in the usual standard surgical fashion. The patient received preoperative IV antibiotic. A surgical timeout was performed. ERAS protocol used.   Access to the abdomen was achieved using a 5 mm 0 laparoscope thru a 5 mm trocar In the left upper Quadrant 2 fingerbreadths below the left subcostal margin using the  Optiview technique. Pneumoperitoneum was smoothly established up to 15 mm of mercury. The laparoscope was advanced and the abdominal cavity was surveilled. The patient was then placed in reverse Trendelenburg.   A 5 mm trocar was placed slightly above and to the left of the umbilicus under direct visualization.  The [facility] liver retractor was placed under the left lobe of the liver through a 5 mm trocar incision site in the subxiphoid position. A 5 mm trocar was placed in the lateral right upper quadrant along with a 15 mm trocar in the mid right abdomen. A final 5 mm trocar was placed in the lateral LUQ.  All under direct visualization after exparel had been infiltrated in bilateral lateral upper abdominal walls as a TAP block.  The stomach was inspected. It was completely decompressed and the orogastric tube was removed.  The calibration tube was placed in the oropharynx and guided down into the stomach by the CRNA. 10 mL of air was insufflated into the calibration balloon. The calibration tubing was then gently pulled back by the CRNA and it slid past the GE junction. At this point the calibration tubing was desufflated and pulled back into the esophagus. This confirmed my suspicion of a clinically significant hiatal hernia. The gastrohepatic ligament was incised with harmonic scalpel. The right crus was identified. We identified the crossing fat along the right crus. The adipose tissue just above this area was incised with harmonic scalpel. I then bluntly dissected out this area and identified the left crus. There was evidence of a hiatal hernia. I then mobilized the esophagus. The left and right crus were further mobilized with blunt dissection. I was then able to reapproximate the left and right crus with 0  Ethibond using an Endostitch suture device and securing it with a titanium tyknot. I placed a second suture in a similar fashion. We then had the CRNA readvanced the calibration tubing back into the  stomach. 10 mL of air was insufflated into the calibration tube balloon. The calibration tube was then gently pulled back and there was resistance at the GE junction. The tube did not slide back up into the esophagus. At this point the calibration tubing was deflated and removed from the patient's body.   We identified the pylorus and measured 6 cm proximal to the pylorus and identified an area of where we would start taking down the short gastric vessels. Harmonic scalpel was used to take down the short gastric vessels along the greater curvature of the stomach. We were able to enter the lesser sac. We continued to march along the greater curvature of the stomach taking down the short gastrics. As we approached the gastrosplenic ligament we took care in this area not to injure the spleen. We were able to take down the entire gastrosplenic ligament. We then mobilized the fundus away from the left crus of diaphragm. There were not any significant posterior gastric avascular attachments. This left the stomach completely mobilized. No vessels had been taken down along the lesser curvature of the stomach.  We then reidentified the pylorus. A 40Fr ViSiGi was then placed in the oropharynx and advanced down into the stomach and placed in the distal antrum and positioned along the lesser curvature. It was placed under suction which secured the 40Fr ViSiGi in place along the lesser curve. Then using the Ethicon echelon 60 mm stapler with a green load with ethicon stapleline reinforcement, I placed a stapler along the antrum approximately 5 cm from the pylorus. The stapler was angled so that there is ample room at the angularis incisura. I then fired the first staple load after inspecting it posteriorly to ensure adequate space both anteriorly and posteriorly. At this point I started using 60 mm gold load staple cartridge with ethicon stapleline reinforcement. The echelon stapler was then repositioned with a 60 mm blue  load with ethicon stapleline reinforcement and we continued to march up along the ViSiGi. My assistant was holding traction along the greater curvature stomach along the cauterized short gastric vessels ensuring that the stomach was symmetrically retracted. Prior to each firing of the staple, we rotated the stomach to ensure that there is adequate stomach left.  As we approached the fundus, I used 60 mm blue cartridge with ethicon stapleline reinforcement aiming  lateral to the GE junction after mobilizing some of the esophageal fat pad.  The sleeve was inspected. There is no evidence of cork screw. The staple line appeared hemostatic. The CRNA inflated the ViSiGi to the green zone and the upper abdomen was flooded with saline. There were no bubbles. The sleeve was decompressed and the ViSiGi removed. My assistant scrubbed out and performed an upper endoscopy. The sleeve easily distended with air and the scope was easily advanced to the pylorus. There is no evidence of internal bleeding or cork screwing. There was no narrowing at the angularis. There is no evidence of bubbles. Please see his operative note for further details. The gastric sleeve was decompressed and the endoscope was removed.  The greater curvature the stomach was grasped with a laparoscopic grasper and removed from the 15 mm trocar site.  The liver retractor was removed. I then closed the 15 mm trocar site with 1 interrupted 0  Vicryl sutures through the fascia using the endoclose. The closure was viewed laparoscopically and it was airtight. Remaining Exparel was then infiltrated in the preperitoneal spaces around the trocar sites. Pneumoperitoneum was released. All trocar sites were closed with a 4-0 Monocryl in a subcuticular fashion followed by the application of benzoin, steri-strips, and bandaids. The patient was extubated and taken to the recovery room in stable condition. All needle, instrument, and sponge counts were correct x2. There are no  immediate complications  (1) 60 mm green with ethicon stapleline reinforcement (1) 60 mm gold with ethicon stapleline reinforcement (5) 60 mm blue with 4 ethicon stapleline reinforcement  PLAN OF CARE: Admit to inpatient   PATIENT DISPOSITION:  PACU - hemodynamically stable.   Delay start of Pharmacological VTE agent (>24hrs) due to surgical blood loss or risk of bleeding:  no  Mary Sella. Andrey Campanile, MD, FACS FASMBS General, Bariatric, & Minimally Invasive Surgery [facility], [place]

## 2021-12-21 NOTE — Progress Notes (Signed)
°  Transition of Care (TOC) Screening Note   Patient Details  Name: Eric Blackwell Date of Birth: 1986-04-21   Transition of Care [facility]) CM/SW Contact:    Amada Jupiter, LCSW Phone Number: 12/21/2021, 2:14 PM    Transition of Care Department [facility]) has reviewed patient and no TOC needs have been identified at this time. We will continue to monitor patient advancement through interdisciplinary progression rounds. If new patient transition needs arise, please place a TOC consult.

## 2021-12-21 NOTE — Progress Notes (Signed)
PHARMACY CONSULT FOR:  Risk Assessment for Post-Discharge VTE Following Bariatric Surgery  Post-Discharge VTE Risk Assessment: This patient's probability of 30-day post-discharge VTE is increased due to the factors marked: X Sleeve gastrectomy   Liver disorder (transplant, cirrhosis, or nonalcoholic steatohepatitis)   Hx of VTE   Hemorrhage requiring transfusion   GI perforation, leak, or obstruction   ====================================================  X  Male    Age >/=60 years  X  BMI >/=50 kg/m2    CHF    Dyspnea at Rest    Paraplegia  X  Non-gastric-band surgery    Operation Time >/=3 hr    Return to OR     Length of Stay >/= 3 d   Hypercoagulable condition   Significant venous stasis   Predicted probability of 30-day post-discharge VTE: 0.51% (moderate)  Other patient-specific factors to consider: N/A  Recommendation for Discharge: Enoxaparin 60 mg [place] q12h x 2 weeks post-discharge  Eric Blackwell is a 36 y.o. male who underwent laparoscopic sleeve gastrectomy on 12/21/21   Case start: 0805 Case end: 0928   Allergies  Allergen Reactions   Sulfa Antibiotics     Unknown reaction, childhood allergy    Patient Measurements: Weight: (!) 180.9 kg (398 lb 12.8 oz) Body mass index is 54.09 kg/m.  Recent Labs    12/21/21 1027  HGB 14.9  HCT 45.1   CrCl cannot be calculated (No successful lab value found.).    Past Medical History:  Diagnosis Date   ADHD    Asthma    As a child   Carpal tunnel syndrome    Pneumonia    Pre-diabetes    Sciatica    Sleep apnea      Medications Prior to Admission  Medication Sig Dispense Refill Last Dose   calcium carbonate (TUMS - DOSED IN MG ELEMENTAL CALCIUM) 500 MG chewable tablet Chew 1 tablet by mouth 3 (three) times daily as needed for indigestion or heartburn.   Past Month   guaiFENesin (MUCINEX) 600 MG 12 hr tablet Take 600 mg by mouth 2 (two) times daily.   Past Week   tadalafil (CIALIS) 20 MG tablet Take  20 mg by mouth daily as needed for erectile dysfunction.   Past Week     Rexford Maus, PharmD 12/21/2021 10:56 AM

## 2021-12-22 ENCOUNTER — Other Ambulatory Visit (HOSPITAL_COMMUNITY): Payer: Self-pay

## 2021-12-22 ENCOUNTER — Encounter (HOSPITAL_COMMUNITY): Payer: Self-pay | Admitting: General Surgery

## 2021-12-22 LAB — COMPREHENSIVE METABOLIC PANEL
ALT: 61 U/L — ABNORMAL HIGH (ref 0–44)
AST: 45 U/L — ABNORMAL HIGH (ref 15–41)
Albumin: 3.8 g/dL (ref 3.5–5.0)
Alkaline Phosphatase: 56 U/L (ref 38–126)
Anion gap: 5 (ref 5–15)
BUN: 10 mg/dL (ref 6–20)
CO2: 24 mmol/L (ref 22–32)
Calcium: 8.9 mg/dL (ref 8.9–10.3)
Chloride: 107 mmol/L (ref 98–111)
Creatinine, Ser: 0.91 mg/dL (ref 0.61–1.24)
GFR, Estimated: >60 mL/min (ref >60)
Glucose, Bld: 113 mg/dL — ABNORMAL HIGH (ref 70–99)
Potassium: 3.9 mmol/L (ref 3.5–5.1)
Sodium: 136 mmol/L (ref 135–145)
Total Bilirubin: 0.5 mg/dL (ref 0.3–1.2)
Total Protein: 7.2 g/dL (ref 6.5–8.1)

## 2021-12-22 LAB — CBC WITH DIFFERENTIAL/PLATELET
Abs Immature Granulocytes: 0.04 10*3/uL (ref 0.00–0.07)
Basophils Absolute: 0 10*3/uL (ref 0.0–0.1)
Basophils Relative: 0 %
Eosinophils Absolute: 0 10*3/uL (ref 0.0–0.5)
Eosinophils Relative: 0 %
HCT: 43.1 % (ref 39.0–52.0)
Hemoglobin: 14 g/dL (ref 13.0–17.0)
Immature Granulocytes: 0 %
Lymphocytes Relative: 18 %
Lymphs Abs: 2.1 10*3/uL (ref 0.7–4.0)
MCH: 30.2 pg (ref 26.0–34.0)
MCHC: 32.5 g/dL (ref 30.0–36.0)
MCV: 93.1 fL (ref 80.0–100.0)
Monocytes Absolute: 1 10*3/uL (ref 0.1–1.0)
Monocytes Relative: 9 %
Neutro Abs: 8.7 10*3/uL — ABNORMAL HIGH (ref 1.7–7.7)
Neutrophils Relative %: 73 %
Platelets: 210 10*3/uL (ref 150–400)
RBC: 4.63 MIL/uL (ref 4.22–5.81)
RDW: 12.6 % (ref 11.5–15.5)
WBC: 11.8 10*3/uL — ABNORMAL HIGH (ref 4.0–10.5)
nRBC: 0 % (ref 0.0–0.2)

## 2021-12-22 LAB — SURGICAL PATHOLOGY

## 2021-12-22 MED ORDER — TRAMADOL HCL 50 MG PO TABS: 50.0000 mg | ORAL_TABLET | Freq: Four times a day (QID) | ORAL | 0 refills | Status: AC | PRN

## 2021-12-22 MED ORDER — TRAMADOL HCL 50 MG PO TABS
50.0000 mg | ORAL_TABLET | Freq: Four times a day (QID) | ORAL | 0 refills | Status: DC | PRN
  Filled 2021-12-22: qty 10, 3d supply, fill #0

## 2021-12-22 MED ORDER — ONDANSETRON 4 MG PO TBDP: 4.0000 mg | ORAL_TABLET | Freq: Four times a day (QID) | ORAL | 0 refills | Status: AC | PRN

## 2021-12-22 MED ORDER — ENOXAPARIN SODIUM 60 MG/0.6ML IJ SOSY
60.0000 mg | PREFILLED_SYRINGE | Freq: Two times a day (BID) | INTRAMUSCULAR | 0 refills | Status: DC
  Filled 2021-12-22: qty 16.8, 14d supply, fill #0

## 2021-12-22 MED ORDER — PANTOPRAZOLE SODIUM 40 MG PO TBEC: 40.0000 mg | DELAYED_RELEASE_TABLET | Freq: Every day | ORAL | 0 refills | Status: AC

## 2021-12-22 MED ORDER — PANTOPRAZOLE SODIUM 40 MG PO TBEC
40.0000 mg | DELAYED_RELEASE_TABLET | Freq: Every day | ORAL | 0 refills | Status: DC
  Filled 2021-12-22: qty 90, 90d supply, fill #0

## 2021-12-22 MED ORDER — ACETAMINOPHEN 500 MG PO TABS: 1000.0000 mg | ORAL_TABLET | Freq: Three times a day (TID) | ORAL | 0 refills | Status: AC

## 2021-12-22 MED ORDER — ONDANSETRON 4 MG PO TBDP
4.0000 mg | ORAL_TABLET | Freq: Four times a day (QID) | ORAL | 0 refills | Status: DC | PRN
  Filled 2021-12-22: qty 20, 5d supply, fill #0

## 2021-12-22 MED ORDER — ENOXAPARIN SODIUM 60 MG/0.6ML IJ SOSY: 60.0000 mg | PREFILLED_SYRINGE | Freq: Two times a day (BID) | INTRAMUSCULAR | 0 refills | Status: AC

## 2021-12-22 NOTE — Progress Notes (Signed)
Pt alert and oriented. Tolerating liquids. D/C instructions given and pt d/cd home.

## 2021-12-22 NOTE — Progress Notes (Signed)
Patient alert and oriented, Post op day 1.  Provided support and encouragement.  Encouraged pulmonary toilet, ambulation and small sips of liquids.  All questions answered.  Will continue to monitor. 

## 2021-12-22 NOTE — Progress Notes (Signed)
Patient alert and oriented, pain is controlled. Patient is tolerating fluids, advanced to protein shake today, patient is tolerating well.  Reviewed Gastric sleeve discharge instructions with patient and patient is able to articulate understanding.  Provided information on BELT program, Support Group and [facility] outpatient pharmacy. All questions answered, will continue to monitor.   Reviewed Lovenox teaching kit and pt reviewed Lovenox "Patient-Self Injection Video".

## 2021-12-22 NOTE — Progress Notes (Signed)
24hr fluid recall prior to discharge: 540mL. Per dehydration protocol, will call pt to f/u within one week post op. 

## 2021-12-24 ENCOUNTER — Ambulatory Visit (INDEPENDENT_AMBULATORY_CARE_PROVIDER_SITE_OTHER): Payer: BC Managed Care – PPO

## 2021-12-24 ENCOUNTER — Telehealth (HOSPITAL_COMMUNITY): Payer: Self-pay | Admitting: *Deleted

## 2021-12-24 ENCOUNTER — Other Ambulatory Visit: Payer: Self-pay

## 2021-12-24 ENCOUNTER — Other Ambulatory Visit: Payer: Self-pay | Admitting: Student

## 2021-12-24 VITALS — BP 125/84 | HR 78 | Temp 97.4°F | Resp 18 | Ht 72.0 in | Wt 393.2 lb

## 2021-12-24 DIAGNOSIS — E860 Dehydration: Secondary | ICD-10-CM

## 2021-12-24 MED ORDER — SODIUM CHLORIDE 0.9 % IV BOLUS
2000.0000 mL | Freq: Once | INTRAVENOUS | Status: AC
  Administered 2021-12-24: 2000 mL via INTRAVENOUS
  Filled 2021-12-24: qty 2000

## 2021-12-24 NOTE — Telephone Encounter (Signed)
1.  Tell me about your pain and pain management? Pt denies any pain.  2.  Let's talk about fluid intake.  How much total fluid are you taking in? Pt states that he is getting in at least 64oz of fluid including protein shakes, bottled water.  3.  How much protein have you taken in the last 2 days? Pt states he is meeting his goal of 80g of protein each day with the protein shakes.   4.  Have you had nausea?  Tell me about when have experienced nausea and what you did to help? Pt denies nausea.   5.  Has the frequency or color changed with your urine? Pt states that he is urinating "fine" with no changes in frequency or urgency.     6.  Tell me what your incisions look like? "Incisions look fine". Pt denies a fever, chills.  Pt states incisions are not swollen, open, or draining.  Pt encouraged to call [facility] if incisions change.   7.  Have you been passing gas? BM? Pt states that he is having BMs. Last BM 12/24/21.  Pt states that he had a loose stool yesterday and an episode of diarrhea today.    8.  If a problem or question were to arise who would you call?  Do you know contact numbers for [place], [facility], and [facility]? Pt c/o feeling lighthead and dizzy when standing.  Pt has already consumed approx 50oz of fluid this morning.  Pt can describe s/sx of dehydration.  Pt knows to call [facility] for surgical, [facility] for nutrition, and [place] for non-urgent questions or concerns.   9.  How has the walking going? Pt states he is walking around and able to be active without much difficulty, with the exception of being lightheaded.   10. Are you still using your incentive spirometer?  If so, how often? Pt states that he is doing the I.S. Pt encouraged to use incentive spirometer, at least 10x every hour while awake until he sees the surgeon.  11.  How are your vitamins and calcium going?  How are you taking them? Pt states that he is taking the supplements and vitamins without difficulty.  Called [facility] Triage on  behalf of pt to discuss pt being symptomatic in spite of liquid intake. Office to f/u with pt.  Pt stated that he is taking the Lovenox injections q12h without difficulty.  Reminded patient that the first 30 days post-operatively are important for successful recovery.  Practice good hand hygiene, wearing a mask when appropriate (since optional in most places), and minimizing exposure to people who live outside of the home, especially if they are exhibiting any respiratory, GI, or illness-like symptoms.

## 2021-12-24 NOTE — Progress Notes (Signed)
Diagnosis: Dehydration  Provider:  Chilton Greathouse, MD  Procedure: Infusion  IV Type: Peripheral, IV Location: R Hand  Normal saline, Dose:  Infusion Start Time: 1331  Infusion Stop Time: 1552  Post Infusion IV Care: Peripheral IV Discontinued  Discharge: Condition: Good, Destination: Home . AVS provided to patient.   Performed by:  Adriana Mccallum, RN

## 2021-12-24 NOTE — Discharge Summary (Signed)
Physician Discharge Summary  Eric Blackwell MCN:[id number] DOB: [birth year] DOA: 12/21/2021  PCP: Janie Morning, DO  Admit date: 12/21/2021 Discharge date: 12/22/2021  Recommendations for Outpatient Follow-up:    Follow-up Information     Greer Pickerel, MD. Go on 01/14/2022.   Specialty: General Surgery Why: at 9:30am. Please arrive 15 minutes prior to your appointment time. Thank you. Contact information: [address] [place] [address] [phone]         Carlena Hurl, PA-C. Go on 02/18/2022.   Specialty: General Surgery Why: at 9:15am for Dr. Redmond Pulling.  Please arrive 15 minutes prior to your appointment time.  Thank you. Contact information: [address] [place] [address] [phone]                Discharge Diagnoses:  Principal Problem:   S/P laparoscopic sleeve gastrectomy Severe obesity (BMI 54)  Pure hypercholesterolemia  Bilateral chronic knee pain  Sciatica of right side  Prediabetes  Elevated blood pressure reading without diagnosis of hypertension  Obstructive sleep apnea  Varicose veins of both legs with edema  Sliding hiatal hernia   Surgical Procedure: Laparoscopic Sleeve Gastrectomy with sliding hiatal hernia repair, upper endoscopy  Discharge Condition: Good Disposition: Home  Diet recommendation: Postoperative sleeve gastrectomy diet (liquids only)  Filed Weights   12/21/21 0535 12/21/21 1501  Weight: (!) 180.9 kg (!) 180.9 kg     Hospital Course:  The patient was admitted for a planned laparoscopic sleeve gastrectomy. Please see operative note. Preoperatively the patient was given 5000 units of subcutaneous heparin for DVT prophylaxis. Postoperative prophylactic Lovenox dosing was started on the evening of postoperative day 0. ERAS protocol was used. On the evening of postoperative day 0, the patient was started on water and ice chips. On postoperative day 1 the patient had no fever or tachycardia and  was tolerating water in their diet was gradually advanced throughout the day. The patient was ambulating without difficulty. Their vital signs are stable without fever or tachycardia. Their hemoglobin had remained stable.  The patient had received discharge instructions and counseling. They were deemed stable for discharge and had met discharge criteria   Discharge Instructions  Discharge Instructions     Ambulate hourly while awake   Complete by: As directed    Call MD for:  difficulty breathing, headache or visual disturbances   Complete by: As directed    Call MD for:  persistant dizziness or light-headedness   Complete by: As directed    Call MD for:  persistant nausea and vomiting   Complete by: As directed    Call MD for:  redness, tenderness, or signs of infection (pain, swelling, redness, odor or green/yellow discharge around incision site)   Complete by: As directed    Call MD for:  severe uncontrolled pain   Complete by: As directed    Call MD for:  temperature >101 F   Complete by: As directed    Diet bariatric full liquid   Complete by: As directed    Discharge instructions   Complete by: As directed    See bariatric discharge instructions   Incentive spirometry   Complete by: As directed    Perform hourly while awake      Allergies as of 12/22/2021       Reactions   Sulfa Antibiotics    Unknown reaction, childhood allergy        Medication List     TAKE these medications  acetaminophen 500 MG tablet Commonly known as: TYLENOL Take 2 tablets (1,000 mg total) by mouth every 8 (eight) hours for 5 days.   calcium carbonate 500 MG chewable tablet Commonly known as: TUMS - dosed in mg elemental calcium Chew 1 tablet by mouth 3 (three) times daily as needed for indigestion or heartburn.   enoxaparin 60 MG/0.6ML injection Commonly known as: LOVENOX Inject 0.6 mLs (60 mg total) into the skin 2 (two) times daily for 14 days.   guaiFENesin 600 MG 12 hr  tablet Commonly known as: MUCINEX Take 600 mg by mouth 2 (two) times daily.   ondansetron 4 MG disintegrating tablet Commonly known as: ZOFRAN-ODT Dissolve 1 tablet (4 mg total) by mouth every 6 (six) hours as needed for nausea or vomiting.   pantoprazole 40 MG tablet Commonly known as: PROTONIX Take 1 tablet (40 mg total) by mouth daily.   tadalafil 20 MG tablet Commonly known as: CIALIS Take 20 mg by mouth daily as needed for erectile dysfunction.   traMADol 50 MG tablet Commonly known as: ULTRAM Take 1 tablet (50 mg total) by mouth every 6 (six) hours as needed (pain).        Follow-up Information     Greer Pickerel, MD. Go on 01/14/2022.   Specialty: General Surgery Why: at 9:30am. Please arrive 15 minutes prior to your appointment time. Thank you. Contact information: [address] [place] [address] [phone]         Carlena Hurl, PA-C. Go on 02/18/2022.   Specialty: General Surgery Why: at 9:15am for Dr. Redmond Pulling.  Please arrive 15 minutes prior to your appointment time.  Thank you. Contact information: [address] [place] [address] [phone]                  The results of significant diagnostics from this hospitalization (including imaging, microbiology, ancillary and laboratory) are listed below for reference.    Significant Diagnostic Studies: No results found.  Labs: Basic Metabolic Panel: Recent Labs  Lab 12/22/21 0438  NA 136  K 3.9  CL 107  CO2 24  GLUCOSE 113*  BUN 10  CREATININE 0.91  CALCIUM 8.9   Liver Function Tests: Recent Labs  Lab 12/22/21 0438  AST 45*  ALT 61*  ALKPHOS 56  BILITOT 0.5  PROT 7.2  ALBUMIN 3.8    CBC: Recent Labs  Lab 12/21/21 1027 12/22/21 0438  WBC  --  11.8*  NEUTROABS  --  8.7*  HGB 14.9 14.0  HCT 45.1 43.1  MCV  --  93.1  PLT  --  210    CBG: No results for input(s): GLUCAP in the last 168 hours.  Principal Problem:   S/P laparoscopic sleeve  gastrectomy   Time coordinating discharge: 15 min  Signed:  Gayland Curry, MD [facility], [place] [phone] 12/24/2021, 7:11 AM

## 2021-12-24 NOTE — Addendum Note (Signed)
Addended by: Kellie Shropshire on: 12/24/2021 01:05 PM   Modules accepted: Orders

## 2021-12-27 ENCOUNTER — Other Ambulatory Visit (HOSPITAL_COMMUNITY): Payer: Self-pay

## 2021-12-28 ENCOUNTER — Telehealth: Payer: Self-pay | Admitting: Skilled Nursing Facility1

## 2021-12-28 DIAGNOSIS — F332 Major depressive disorder, recurrent severe without psychotic features: Secondary | ICD-10-CM | POA: Diagnosis not present

## 2021-12-28 DIAGNOSIS — F900 Attention-deficit hyperactivity disorder, predominantly inattentive type: Secondary | ICD-10-CM | POA: Diagnosis not present

## 2021-12-28 DIAGNOSIS — F4311 Post-traumatic stress disorder, acute: Secondary | ICD-10-CM | POA: Diagnosis not present

## 2021-12-28 NOTE — Telephone Encounter (Signed)
Returned pts call.  Pt states he has been having headaches and dizziness since the surgery daily all day long. Pt states he has had dehydration so it was not dehydration.   Pt states he takes the multivitamin and prilosec and drinks his water cold and protein smoothie: oatmilk (12 g CHO 0 g sugar), protein powder, low fat yogurt  Water throughout the day: 96 oz  Chocolate calcium   Pre-made protein shake: plant based (9 g sugar)  Pt states he does not feel hunger  Broth and a popcicle   Pt states he tried cream of mushroom soup which was more satisfying.   Pt states he takes a 10 minutes walk and in general is active   Pt sates he is not experiencing any other symptoms and does not feel he is sick.    Plan: Compare the ingredients from orgain to your current protein supplements looking for similar ingredients for headache trigger: if that falls through perhaps you are consuming too much sugar so cut out the R[place] and use the smoothies instead if that is not the answer increase your calories as the calorie deficit may be the culprit

## 2021-12-29 ENCOUNTER — Other Ambulatory Visit: Payer: Self-pay

## 2021-12-29 DIAGNOSIS — Z9884 Bariatric surgery status: Secondary | ICD-10-CM | POA: Diagnosis not present

## 2022-01-03 ENCOUNTER — Telehealth: Payer: Self-pay | Admitting: Skilled Nursing Facility1

## 2022-01-03 NOTE — Telephone Encounter (Signed)
Called pt to check in on status.  ? ? ?Pt states he is doing better. Pt states he stopped drinking the protein shakes and started taking tylenol which has substantially helped and added more calories so feeling not as weak.  ?

## 2022-01-04 ENCOUNTER — Other Ambulatory Visit: Payer: Self-pay

## 2022-01-04 ENCOUNTER — Encounter: Payer: BC Managed Care – PPO | Attending: General Surgery | Admitting: Skilled Nursing Facility1

## 2022-01-04 DIAGNOSIS — F332 Major depressive disorder, recurrent severe without psychotic features: Secondary | ICD-10-CM | POA: Diagnosis not present

## 2022-01-04 DIAGNOSIS — F4311 Post-traumatic stress disorder, acute: Secondary | ICD-10-CM | POA: Diagnosis not present

## 2022-01-04 DIAGNOSIS — F900 Attention-deficit hyperactivity disorder, predominantly inattentive type: Secondary | ICD-10-CM | POA: Diagnosis not present

## 2022-01-05 NOTE — Progress Notes (Signed)
2 Week Post-Operative Nutrition Class ?  ?Patient was seen on 01/04/2022 for Post-Operative Nutrition education at the [facility].  ?  ?Surgery date: 12/21/2021 ?Surgery type: sleeve ?Start weight at [facility]: 404.6 pounds ?Weight today: 380.2 pounds ?Bowel Habits: Every day to every other day no complaints ?  ?Body Composition Scale 01/04/2022  ?Current Body Weight 380.2  ?Total Body Fat % 41.2  ?Visceral Fat 36  ?Fat-Free Mass % 58.7  ? Total Body Water % 39.7  ?Muscle-Mass lbs 71.8  ?BMI 51.8  ?Body Fat Displacement   ?       Torso  lbs 97.4  ?       Left Leg  lbs 19.4  ?       Right Leg  lbs 19.4  ?       Left Arm  lbs 9.7  ?       Right Arm   lbs 9.7  ? ? ?  ?The following the learning objectives were met by the patient during this course: ?Identifies Phase 3 (Soft, High Proteins) Dietary Goals and will begin from 2 weeks post-operatively to 2 months post-operatively ?Identifies appropriate sources of fluids and proteins  ?Identifies appropriate fat sources and healthy verses unhealthy fat types   ?States protein recommendations and appropriate sources post-operatively ?Identifies the need for appropriate texture modifications, mastication, and bite sizes when consuming solids ?Identifies appropriate fat consumption and sources ?Identifies appropriate multivitamin and calcium sources post-operatively ?Describes the need for physical activity post-operatively and will follow MD recommendations ?States when to call healthcare provider regarding medication questions or post-operative complications ?  ?Handouts given during class include: ?Phase 3A: Soft, High Protein Diet Handout ?Phase 3 High Protein Meals ?Healthy Fats ?  ?Follow-Up Plan: ?Patient will follow-up at [facility] in 6 weeks for 2 month post-op nutrition visit for diet advancement per MD. ? ?

## 2022-01-10 ENCOUNTER — Telehealth: Payer: Self-pay | Admitting: Skilled Nursing Facility1

## 2022-01-10 NOTE — Telephone Encounter (Signed)
RD called pt to verify fluid intake once starting soft, solid proteins 2 week post-bariatric surgery.  ? ?Daily Fluid intake: 64+  ?Daily Protein intake: 80 g ?Bowel Habits: every day to every other day ? ?Concerns/issues:  ? ?Pt states he just has to do better about taking smaller bites but doing well overall.  ?

## 2022-01-11 DIAGNOSIS — F900 Attention-deficit hyperactivity disorder, predominantly inattentive type: Secondary | ICD-10-CM | POA: Diagnosis not present

## 2022-01-11 DIAGNOSIS — F4311 Post-traumatic stress disorder, acute: Secondary | ICD-10-CM | POA: Diagnosis not present

## 2022-01-11 DIAGNOSIS — F332 Major depressive disorder, recurrent severe without psychotic features: Secondary | ICD-10-CM | POA: Diagnosis not present

## 2022-01-18 DIAGNOSIS — F332 Major depressive disorder, recurrent severe without psychotic features: Secondary | ICD-10-CM | POA: Diagnosis not present

## 2022-01-18 DIAGNOSIS — F900 Attention-deficit hyperactivity disorder, predominantly inattentive type: Secondary | ICD-10-CM | POA: Diagnosis not present

## 2022-01-18 DIAGNOSIS — F4311 Post-traumatic stress disorder, acute: Secondary | ICD-10-CM | POA: Diagnosis not present

## 2022-01-25 DIAGNOSIS — F900 Attention-deficit hyperactivity disorder, predominantly inattentive type: Secondary | ICD-10-CM | POA: Diagnosis not present

## 2022-01-25 DIAGNOSIS — F4311 Post-traumatic stress disorder, acute: Secondary | ICD-10-CM | POA: Diagnosis not present

## 2022-01-25 DIAGNOSIS — F332 Major depressive disorder, recurrent severe without psychotic features: Secondary | ICD-10-CM | POA: Diagnosis not present

## 2022-02-01 DIAGNOSIS — F332 Major depressive disorder, recurrent severe without psychotic features: Secondary | ICD-10-CM | POA: Diagnosis not present

## 2022-02-01 DIAGNOSIS — F900 Attention-deficit hyperactivity disorder, predominantly inattentive type: Secondary | ICD-10-CM | POA: Diagnosis not present

## 2022-02-01 DIAGNOSIS — F4311 Post-traumatic stress disorder, acute: Secondary | ICD-10-CM | POA: Diagnosis not present

## 2022-02-15 DIAGNOSIS — F332 Major depressive disorder, recurrent severe without psychotic features: Secondary | ICD-10-CM | POA: Diagnosis not present

## 2022-02-15 DIAGNOSIS — F4311 Post-traumatic stress disorder, acute: Secondary | ICD-10-CM | POA: Diagnosis not present

## 2022-02-15 DIAGNOSIS — F900 Attention-deficit hyperactivity disorder, predominantly inattentive type: Secondary | ICD-10-CM | POA: Diagnosis not present

## 2022-02-16 ENCOUNTER — Encounter: Payer: BC Managed Care – PPO | Attending: General Surgery | Admitting: Skilled Nursing Facility1

## 2022-02-16 DIAGNOSIS — G8929 Other chronic pain: Secondary | ICD-10-CM | POA: Diagnosis not present

## 2022-02-16 DIAGNOSIS — M25562 Pain in left knee: Secondary | ICD-10-CM | POA: Diagnosis not present

## 2022-02-16 DIAGNOSIS — Z713 Dietary counseling and surveillance: Secondary | ICD-10-CM | POA: Insufficient documentation

## 2022-02-16 DIAGNOSIS — E7800 Pure hypercholesterolemia, unspecified: Secondary | ICD-10-CM | POA: Diagnosis not present

## 2022-02-16 DIAGNOSIS — M5431 Sciatica, right side: Secondary | ICD-10-CM | POA: Insufficient documentation

## 2022-02-16 DIAGNOSIS — M25561 Pain in right knee: Secondary | ICD-10-CM | POA: Insufficient documentation

## 2022-02-16 DIAGNOSIS — Z9189 Other specified personal risk factors, not elsewhere classified: Secondary | ICD-10-CM | POA: Diagnosis not present

## 2022-02-16 DIAGNOSIS — Z6841 Body Mass Index (BMI) 40.0 and over, adult: Secondary | ICD-10-CM | POA: Insufficient documentation

## 2022-02-16 DIAGNOSIS — R030 Elevated blood-pressure reading, without diagnosis of hypertension: Secondary | ICD-10-CM | POA: Diagnosis not present

## 2022-02-16 DIAGNOSIS — R7303 Prediabetes: Secondary | ICD-10-CM | POA: Insufficient documentation

## 2022-02-16 NOTE — Progress Notes (Signed)
Bariatric Nutrition Follow-Up Visit ?Medical Nutrition Therapy  ? ?NUTRITION ASSESSMENT ?  ? ?Surgery date: 12/21/2021 ?Surgery type: sleeve ?Start weight at [facility]: 404.6 pounds ?Weight today: 370 pounds on regular scale/ 366.3 on bio scale w/out shoes ? ?  ?Body Composition Scale 01/04/2022 02/16/2022  ?Current Body Weight 380.2 366.3  ?Total Body Fat % 41.2 40.3  ?Visceral Fat 36 34  ?Fat-Free Mass % 58.7 59.6  ? Total Body Water % 39.7 40.6  ?Muscle-Mass lbs 71.8 69.2  ?BMI 51.8 49.9  ?Body Fat Displacement    ?       Torso  lbs 97.4 91.8  ?       Left Leg  lbs 19.4 18.3  ?       Right Leg  lbs 19.4 18.3  ?       Left Arm  lbs 9.7 9.1  ?       Right Arm   lbs 9.7 9.1  ?  ?Clinical  ?Medical hx: ADHD, sciatica  ?Medications: N/A  ?Labs:  ?Notable signs/symptoms: back pain ?Any previous deficiencies? No ?  ?Lifestyle & Dietary Hx ? ?Pt arrives having added all foods and alcohol back in stating his surgeon advised he can add alcohol back in: pt states he is aware these foods introduced are not in the current phase but is okay with that. Pt states he has been eating gummy candy stating he cannot eat cake.   ?Pt states that he is drinking alcohol: liquor.  ?Pt states he is having stresses keeping him up at night.  ?Pt states he really did not want to lose weight fast due to excess lose skin: Pt then advised dietitian he is worried about looking like his family who has had surgery stating they have a lot of lose skin and are unwell due to not eating enough. ?Pt states he has to make himself eat lunch due to ADHD.  ?Pt states he wants to start playing basketball.  ?Pt states he just wants to be under 300 pounds and not small at all.  ?Pt states he would benefit from support group, so he can see what other patients are doing and how they are successful and see what other bodies look like that have had surgery.  ?Pt states he would like to join [facility], but states his schedule does not allow it. ? ? ?Estimated daily fluid  intake: 64 oz ?Estimated daily protein intake: 80 g ?Supplements:  ?Current average weekly physical activity: ADL's  ? ?24-Hr Dietary Recall ?First Meal: 2 eggs or protein shake (plant based) + frozen banana + oat milk + collagen + greek vanilla yogurt + cashew butter ?Snack:  gummy candy ?Second Meal: protein shake (plant based) + frozen banana + oatmilk + collagen + greek vanilla yogurt + cashew butter or deli meat + cheese stick ?Snack:   ?Third Meal: grilled chicken and kale  ?Snack:  ?Beverages: water, liquor 3 days a week  ? ?Post-Op Goals/ Signs/ Symptoms ?Using straws: no ?Drinking while eating: no ?Chewing/swallowing difficulties: no ?Changes in vision: no ?Changes to mood/headaches: no ?Hair loss/changes to skin/nails: no ?Difficulty focusing/concentrating: no ?Sweating: no ?Limb weakness: no ?Dizziness/lightheadedness: no ?Palpitations: no  ?Carbonated/caffeinated beverages: no ?N/V/D/C/Gas: no ?Abdominal pain: no ?Dumping syndrome: no ? ?  ?NUTRITION DIAGNOSIS  ?Overweight/obesity ([place]-3.3) related to past poor dietary habits and physical inactivity as evidenced by completed bariatric surgery and following dietary guidelines for continued weight loss and healthy nutrition status. ?  ?  ?NUTRITION INTERVENTION ?  Nutrition counseling (C-1) and education (E-2) to facilitate bariatric surgery goals, including: ?Pt self advanced diet to include all foods ?The importance of consuming adequate calories as well as certain nutrients daily due to the body's need for essential vitamins, minerals, and fats ?The importance of daily physical activity and to reach a goal of at least 150 minutes of moderate to vigorous physical activity weekly (or as directed by their physician) due to benefits such as increased musculature and improved lab values ?The importance of intuitive eating specifically learning hunger-satiety cues and understanding the importance of learning a new body: The importance of mindful eating to avoid  grazing behaviors  ?Caloric value of alcohol and appropriate serving sizes for a man ?Caloric value of candy and it being a source of excess calories leading to weight gain ?Description of a healthy body after bariatric surgery ?The importance of perception and how your social group influences it  ? ?Handouts Provided Include  ?BELT Flyer ? ?Learning Style & Readiness for Change ?Teaching method utilized: Visual & Auditory  ?Demonstrated degree of understanding via: Teach Back  ?Readiness Level: contemplative  ?Barriers to learning/adherence to lifestyle change: social influences and traditions ? ?RD's Notes for Next Visit ?Assess adherence to pt chosen goals ? ?MONITORING & EVALUATION ?Dietary intake, weekly physical activity, body weight ? ?Next Steps ?Patient is to follow-up in 2 months ?

## 2022-02-22 DIAGNOSIS — F900 Attention-deficit hyperactivity disorder, predominantly inattentive type: Secondary | ICD-10-CM | POA: Diagnosis not present

## 2022-02-22 DIAGNOSIS — F332 Major depressive disorder, recurrent severe without psychotic features: Secondary | ICD-10-CM | POA: Diagnosis not present

## 2022-02-22 DIAGNOSIS — F4311 Post-traumatic stress disorder, acute: Secondary | ICD-10-CM | POA: Diagnosis not present

## 2022-03-01 DIAGNOSIS — F900 Attention-deficit hyperactivity disorder, predominantly inattentive type: Secondary | ICD-10-CM | POA: Diagnosis not present

## 2022-03-01 DIAGNOSIS — F332 Major depressive disorder, recurrent severe without psychotic features: Secondary | ICD-10-CM | POA: Diagnosis not present

## 2022-03-01 DIAGNOSIS — F4311 Post-traumatic stress disorder, acute: Secondary | ICD-10-CM | POA: Diagnosis not present

## 2022-03-08 DIAGNOSIS — F332 Major depressive disorder, recurrent severe without psychotic features: Secondary | ICD-10-CM | POA: Diagnosis not present

## 2022-03-08 DIAGNOSIS — F4311 Post-traumatic stress disorder, acute: Secondary | ICD-10-CM | POA: Diagnosis not present

## 2022-03-08 DIAGNOSIS — F900 Attention-deficit hyperactivity disorder, predominantly inattentive type: Secondary | ICD-10-CM | POA: Diagnosis not present

## 2022-03-21 DIAGNOSIS — F411 Generalized anxiety disorder: Secondary | ICD-10-CM | POA: Diagnosis not present

## 2022-03-21 DIAGNOSIS — F902 Attention-deficit hyperactivity disorder, combined type: Secondary | ICD-10-CM | POA: Diagnosis not present

## 2022-03-22 DIAGNOSIS — F900 Attention-deficit hyperactivity disorder, predominantly inattentive type: Secondary | ICD-10-CM | POA: Diagnosis not present

## 2022-03-22 DIAGNOSIS — F332 Major depressive disorder, recurrent severe without psychotic features: Secondary | ICD-10-CM | POA: Diagnosis not present

## 2022-03-22 DIAGNOSIS — F4311 Post-traumatic stress disorder, acute: Secondary | ICD-10-CM | POA: Diagnosis not present

## 2022-03-29 DIAGNOSIS — F4311 Post-traumatic stress disorder, acute: Secondary | ICD-10-CM | POA: Diagnosis not present

## 2022-03-29 DIAGNOSIS — F900 Attention-deficit hyperactivity disorder, predominantly inattentive type: Secondary | ICD-10-CM | POA: Diagnosis not present

## 2022-03-29 DIAGNOSIS — F332 Major depressive disorder, recurrent severe without psychotic features: Secondary | ICD-10-CM | POA: Diagnosis not present

## 2022-04-04 DIAGNOSIS — F902 Attention-deficit hyperactivity disorder, combined type: Secondary | ICD-10-CM | POA: Diagnosis not present

## 2022-04-04 DIAGNOSIS — F411 Generalized anxiety disorder: Secondary | ICD-10-CM | POA: Diagnosis not present

## 2022-04-13 ENCOUNTER — Ambulatory Visit: Payer: BC Managed Care – PPO | Admitting: Skilled Nursing Facility1

## 2022-05-02 DIAGNOSIS — F411 Generalized anxiety disorder: Secondary | ICD-10-CM | POA: Diagnosis not present

## 2022-05-02 DIAGNOSIS — F902 Attention-deficit hyperactivity disorder, combined type: Secondary | ICD-10-CM | POA: Diagnosis not present

## 2022-05-04 DIAGNOSIS — F900 Attention-deficit hyperactivity disorder, predominantly inattentive type: Secondary | ICD-10-CM | POA: Diagnosis not present

## 2022-05-04 DIAGNOSIS — F332 Major depressive disorder, recurrent severe without psychotic features: Secondary | ICD-10-CM | POA: Diagnosis not present

## 2022-05-04 DIAGNOSIS — F4311 Post-traumatic stress disorder, acute: Secondary | ICD-10-CM | POA: Diagnosis not present

## 2022-05-23 DIAGNOSIS — F902 Attention-deficit hyperactivity disorder, combined type: Secondary | ICD-10-CM | POA: Diagnosis not present

## 2022-05-23 DIAGNOSIS — F411 Generalized anxiety disorder: Secondary | ICD-10-CM | POA: Diagnosis not present

## 2022-06-01 DIAGNOSIS — Z9884 Bariatric surgery status: Secondary | ICD-10-CM | POA: Diagnosis not present

## 2022-06-01 DIAGNOSIS — I872 Venous insufficiency (chronic) (peripheral): Secondary | ICD-10-CM | POA: Diagnosis not present

## 2022-06-01 DIAGNOSIS — J069 Acute upper respiratory infection, unspecified: Secondary | ICD-10-CM | POA: Diagnosis not present

## 2022-06-01 DIAGNOSIS — L659 Nonscarring hair loss, unspecified: Secondary | ICD-10-CM | POA: Diagnosis not present

## 2022-06-07 DIAGNOSIS — F332 Major depressive disorder, recurrent severe without psychotic features: Secondary | ICD-10-CM | POA: Diagnosis not present

## 2022-06-07 DIAGNOSIS — F4311 Post-traumatic stress disorder, acute: Secondary | ICD-10-CM | POA: Diagnosis not present

## 2022-06-07 DIAGNOSIS — F900 Attention-deficit hyperactivity disorder, predominantly inattentive type: Secondary | ICD-10-CM | POA: Diagnosis not present

## 2022-06-08 ENCOUNTER — Encounter: Payer: BC Managed Care – PPO | Attending: General Surgery | Admitting: Skilled Nursing Facility1

## 2022-06-08 NOTE — Progress Notes (Signed)
Bariatric Nutrition Follow-Up Visit Medical Nutrition Therapy   NUTRITION ASSESSMENT    Surgery date: 12/21/2021 Surgery type: sleeve Start weight at [facility]: 404.6 pounds Weight today: 326.7    Body Composition Scale 01/04/2022 02/16/2022 06/08/2022  Current Body Weight 380.2 366.3 326.7  Total Body Fat % 41.2 40.3 37.2  Visceral Fat 36 34 29  Fat-Free Mass % 58.7 59.6 62.7   Total Body Water % 39.7 40.6 43.7  Muscle-Mass lbs 71.8 69.2 61.7  BMI 51.8 49.9 44.4  Body Fat Displacement            Torso  lbs 97.4 91.8 75.6         Left Leg  lbs 19.4 18.3 15.1         Right Leg  lbs 19.4 18.3 15.1         Left Arm  lbs 9.7 9.1 7.5         Right Arm   lbs 9.7 9.1 7.5    Clinical  Medical hx: ADHD, sciatica  Medications: Focalin, bupropion   Labs:  Notable signs/symptoms: back pain Any previous deficiencies? No   Lifestyle & Dietary Hx   Pt states he did start some new ADHD medicine which has really helped his anxiety so he no longer skips lunch.  Pt states he does not do BELT due to schedule.  Pt states he is lactose intolerant.   Pt states his usual diet habits would be only getting carbohydrates form simple sources so since cutting those out he has not been eating any sources.    Estimated daily fluid intake: 64+ oz Estimated daily protein intake: 80 g Supplements: multi and calcium  Current average weekly physical activity[facility] and walking   24-Hr Dietary Recall First Meal: 2 eggs or protein shake (plant based) + frozen banana + oat milk + collagen + greek vanilla yogurt + cashew butter or nut bar or sometimes scrambled egg Snack: nut bar or protein bar or jerky Second Meal: organic low sodium deli meat + cheese + cherry tomatoes Snack: nut bar or protein bar or jerky Third Meal: grilled chicken and kale or cauliflower + steamed veggies Snack:  Beverages: water, liquor 3 days a week, coffee + oatmilk  Post-Op Goals/ Signs/  Symptoms Using straws: no Drinking while eating: no Chewing/swallowing difficulties: no Changes in vision: no Changes to mood/headaches: no Hair loss/changes to skin/nails: no Difficulty focusing/concentrating: no Sweating: no Limb weakness: no Dizziness/lightheadedness: no Palpitations: no  Carbonated/caffeinated beverages: no N/V/D/C/Gas: no Abdominal pain: no Dumping syndrome: no    NUTRITION DIAGNOSIS  Overweight/obesity ([place]-3.3) related to past poor dietary habits and physical inactivity as evidenced by completed bariatric surgery and following dietary guidelines for continued weight loss and healthy nutrition status.     NUTRITION INTERVENTION Nutrition counseling (C-1) and education (E-2) to facilitate bariatric surgery goals, including: Pt self advanced diet to include all foods The importance of consuming adequate calories as well as certain nutrients daily due to the body's need for essential vitamins, minerals, and fats The importance of daily physical activity and to reach a goal of at least 150 minutes of moderate to vigorous physical activity weekly (or as directed by their physician) due to benefits such as increased musculature and improved lab values The importance of intuitive eating specifically learning hunger-satiety cues and understanding the importance of learning a new body: The importance of mindful eating to avoid grazing behaviors  Caloric value of alcohol  and appropriate serving sizes for a man Caloric value of candy and it being a source of excess calories leading to weight gain Description of a healthy body after bariatric surgery The importance of perception and how your social group influences it  Why you need complex carbohydrates: Whole grains and other complex carbohydrates are required to have a healthy diet. Whole grains provide fiber which can help with blood glucose levels and help keep you satiated. Fruits and starchy vegetables provide  essential vitamins and minerals required for immune function, eyesight support, brain support, bone density, wound healing and many other functions within the body. According to the current evidenced based 2020-2025 Dietary Guidelines for Americans, complex carbohydrates are part of a healthy eating pattern which is associated with a decreased risk for type 2 diabetes, cancers, and cardiovascular disease.   Goals: Add complex carbohydrates back into your diet to empower your lifting abilities  Handouts Provided Include  BEL[facility] Flyer Meal ideas sheet inclusive of complex carbohydrate sources   Learning Style & Readiness for Change Teaching method utilized: Visual & Auditory  Demonstrated degree of understanding via: Teach Back  Readiness Level: Action Barriers to learning/adherence to lifestyle change: social influences and traditions  RD's Notes for Next Visit Assess adherence to pt chosen goals  MONITORING & EVALUATION Dietary intake, weekly physical activity, body weight  Next Steps Patient is to follow-up in 4 months

## 2022-06-16 DIAGNOSIS — F900 Attention-deficit hyperactivity disorder, predominantly inattentive type: Secondary | ICD-10-CM | POA: Diagnosis not present

## 2022-06-16 DIAGNOSIS — F332 Major depressive disorder, recurrent severe without psychotic features: Secondary | ICD-10-CM | POA: Diagnosis not present

## 2022-06-16 DIAGNOSIS — F4311 Post-traumatic stress disorder, acute: Secondary | ICD-10-CM | POA: Diagnosis not present

## 2022-06-21 DIAGNOSIS — F900 Attention-deficit hyperactivity disorder, predominantly inattentive type: Secondary | ICD-10-CM | POA: Diagnosis not present

## 2022-06-21 DIAGNOSIS — F332 Major depressive disorder, recurrent severe without psychotic features: Secondary | ICD-10-CM | POA: Diagnosis not present

## 2022-06-21 DIAGNOSIS — F4311 Post-traumatic stress disorder, acute: Secondary | ICD-10-CM | POA: Diagnosis not present

## 2022-06-27 DIAGNOSIS — F902 Attention-deficit hyperactivity disorder, combined type: Secondary | ICD-10-CM | POA: Diagnosis not present

## 2022-06-27 DIAGNOSIS — F411 Generalized anxiety disorder: Secondary | ICD-10-CM | POA: Diagnosis not present

## 2022-06-28 DIAGNOSIS — F900 Attention-deficit hyperactivity disorder, predominantly inattentive type: Secondary | ICD-10-CM | POA: Diagnosis not present

## 2022-06-28 DIAGNOSIS — F332 Major depressive disorder, recurrent severe without psychotic features: Secondary | ICD-10-CM | POA: Diagnosis not present

## 2022-06-28 DIAGNOSIS — F4311 Post-traumatic stress disorder, acute: Secondary | ICD-10-CM | POA: Diagnosis not present

## 2022-07-08 DIAGNOSIS — Z113 Encounter for screening for infections with a predominantly sexual mode of transmission: Secondary | ICD-10-CM | POA: Diagnosis not present

## 2022-07-10 IMAGING — RF DG UGI W SINGLE CM
7 of 14 series · 7 of 14 positions shown · non-contrast
Comparison: None.

CLINICAL DATA: Preop bariatric surgery.

EXAM:
UPPER GI SERIES WITH KUB
TECHNIQUE: After obtaining a scout radiograph a routine upper GI series was
performed using thin barium.
FLUOROSCOPY TIME:  Fluoroscopy Time:  Limited 24 seconds
Radiation Exposure Index (if provided by the fluoroscopic device):
98 mGy
Number of Acquired Spot Images: 8

[Series 1: t abdomen supine · 0.15mm/px · 1 of 1 slices shown (1 of 2)]
[im 1/1]
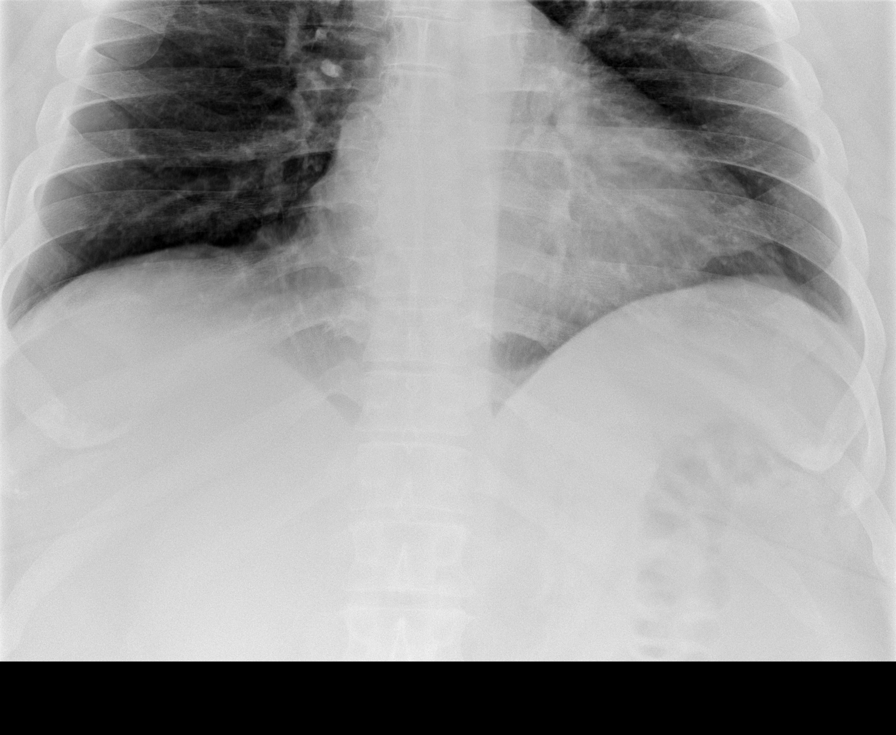

[Series 2: t abdomen supine · 0.15mm/px · 1 of 1 slices shown (2 of 2)]
[im 1/1]
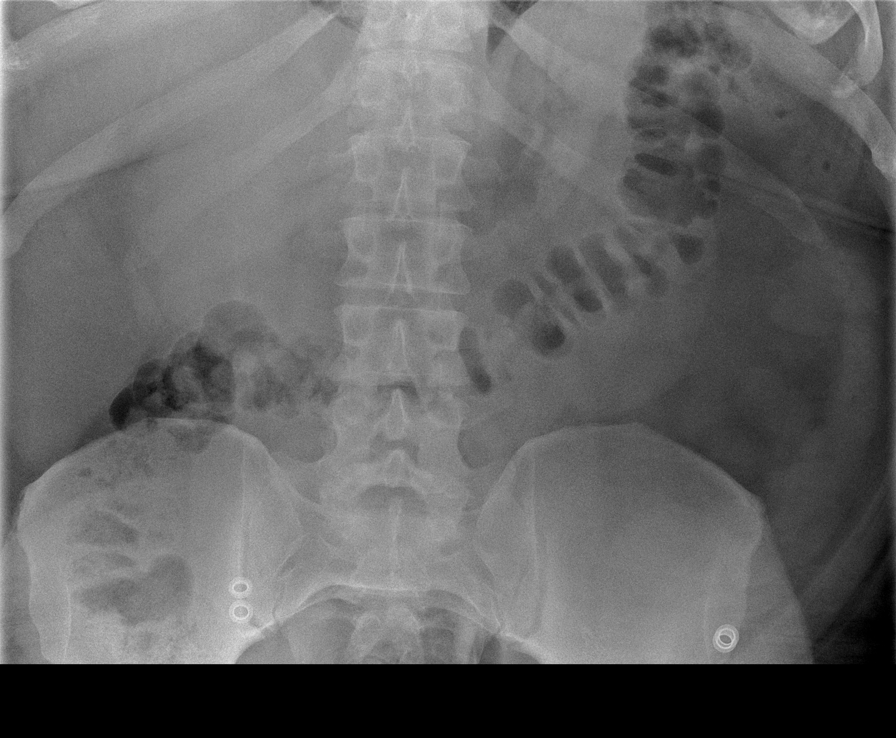

[Series 3: fluoro_barium 2fps_bw · 0.17mm/px · 1 of 1 slices shown (1 of 2)]
[im 1/1]
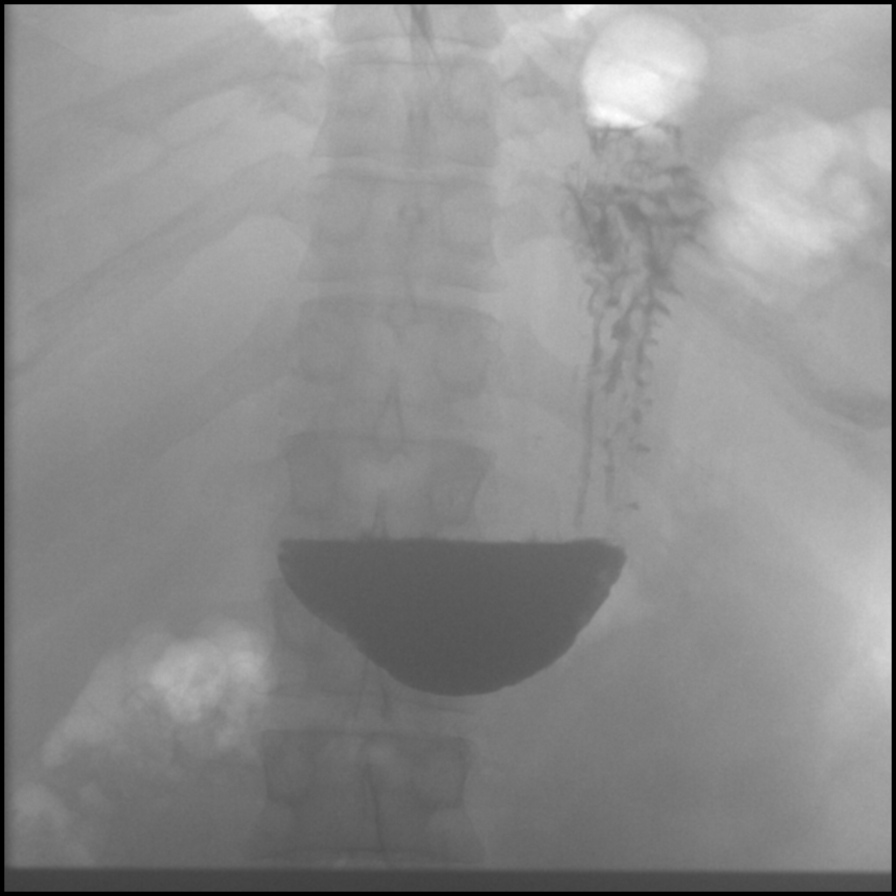

[Series 4: cp_standard · 0.26mm/px · 1 of 1 slices shown (1 of 3)]
[im 1/1]
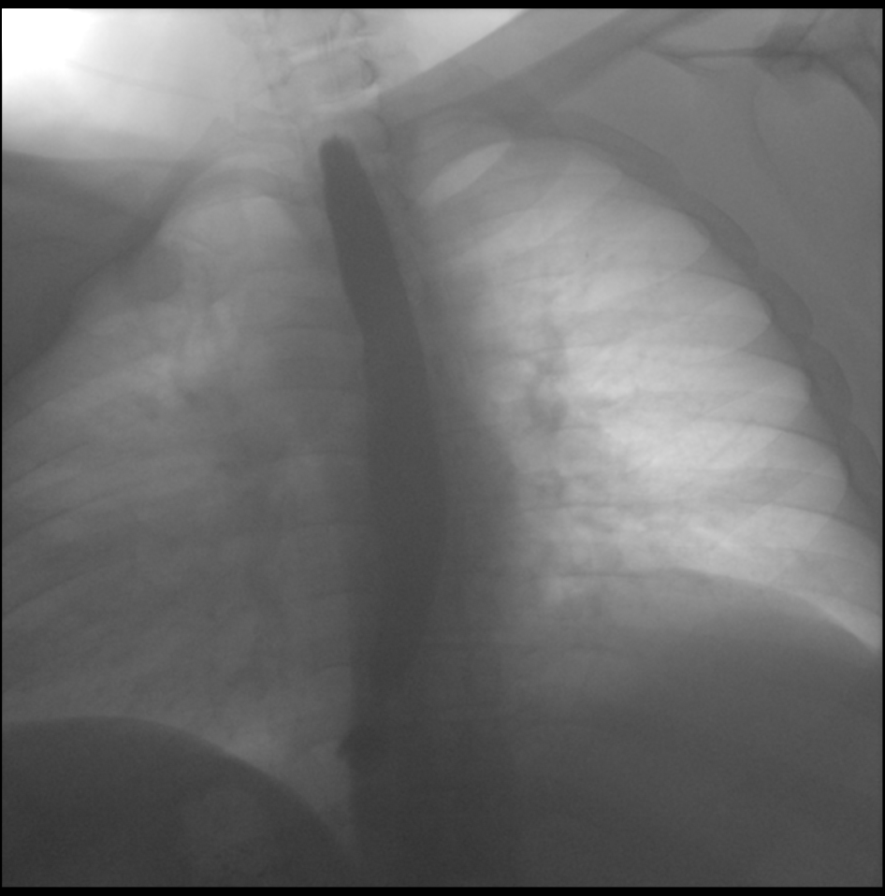

[Series 5: cp_standard · 0.26mm/px · 1 of 1 slices shown (2 of 3)]
[im 1/1]
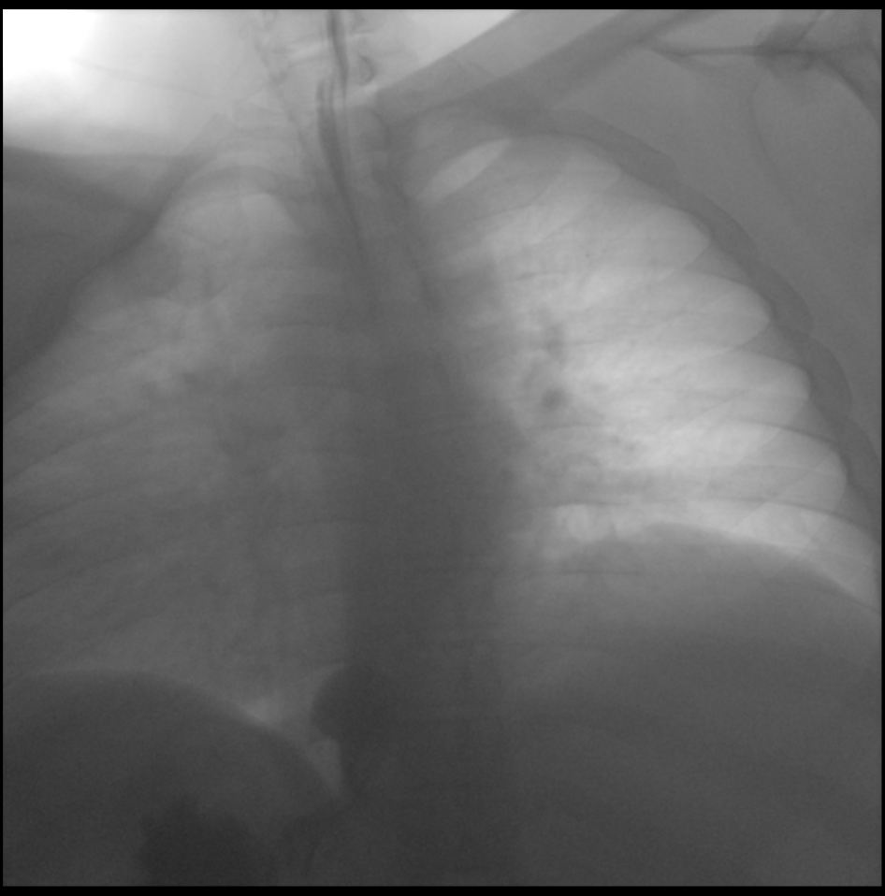

[Series 6: cp_standard · 0.26mm/px · 1 of 1 slices shown (3 of 3)]
[im 1/1]
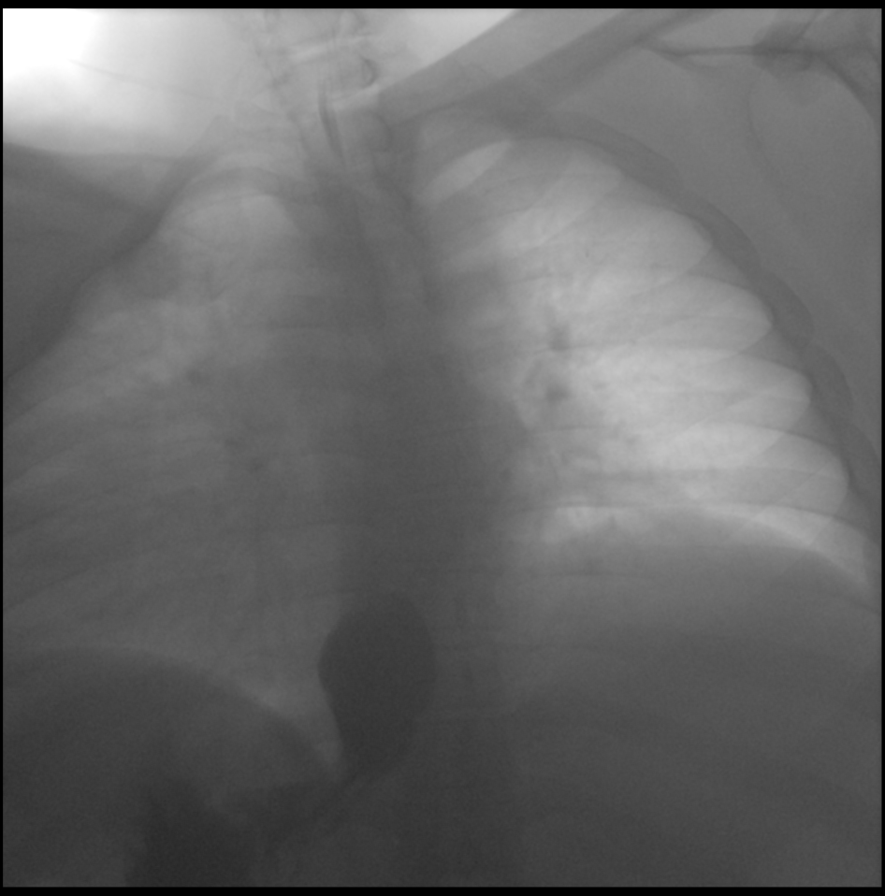

[Series 7: fluoro_barium 2fps_bw · 0.17mm/px · 1 of 1 slices shown (2 of 2)]
[im 1/1]
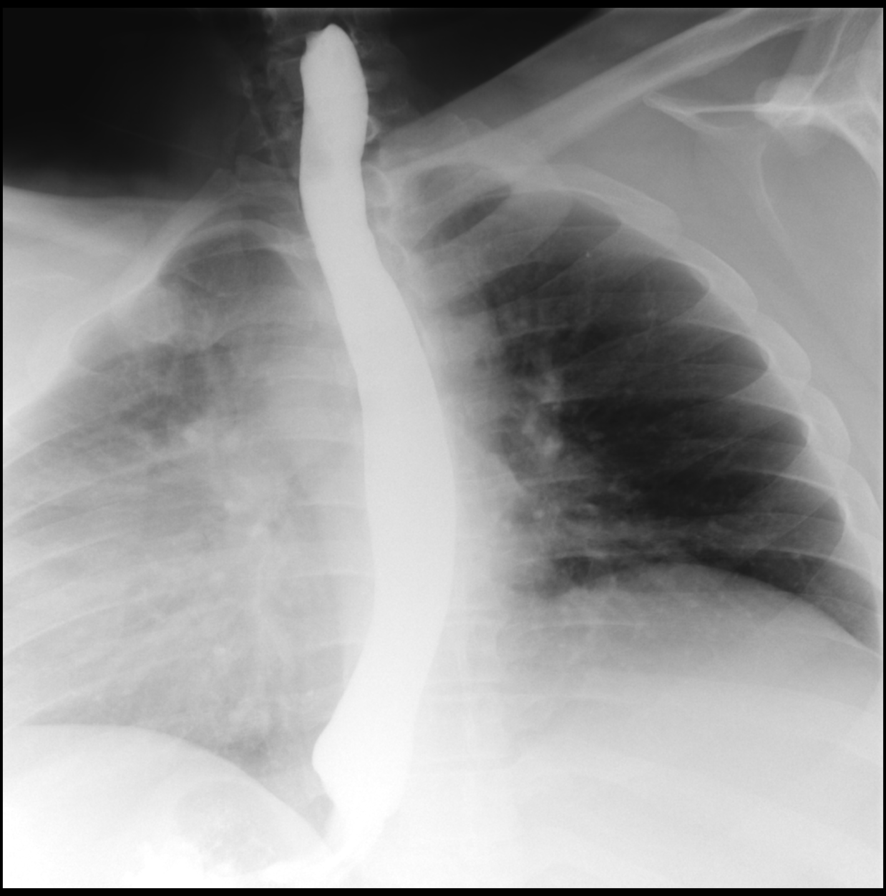

[7 of 14 positions shown; findings below may reference images not displayed]

FINDINGS: Scout view of the abdomen shows a normal bowel gas pattern.

Single contrast examination of the upper gastrointestinal tract
shows normal esophageal motility. No esophageal fold thickening,
stricture or obstruction. Tiny hiatal hernia with a probable wide
mouth distal esophageal ring. Stomach is unremarkable.
IMPRESSION: Tiny hiatal hernia with a possible wide-mouth distal esophageal
ring.

## 2022-07-10 IMAGING — CR DG CHEST 2V
2 series · 2 of 2 positions shown · non-contrast
Comparison: None.

CLINICAL DATA: 34-year-old male with severe obesity, preoperative
chest x-ray

EXAM:
CHEST - 2 VIEW

[w chest pa *]
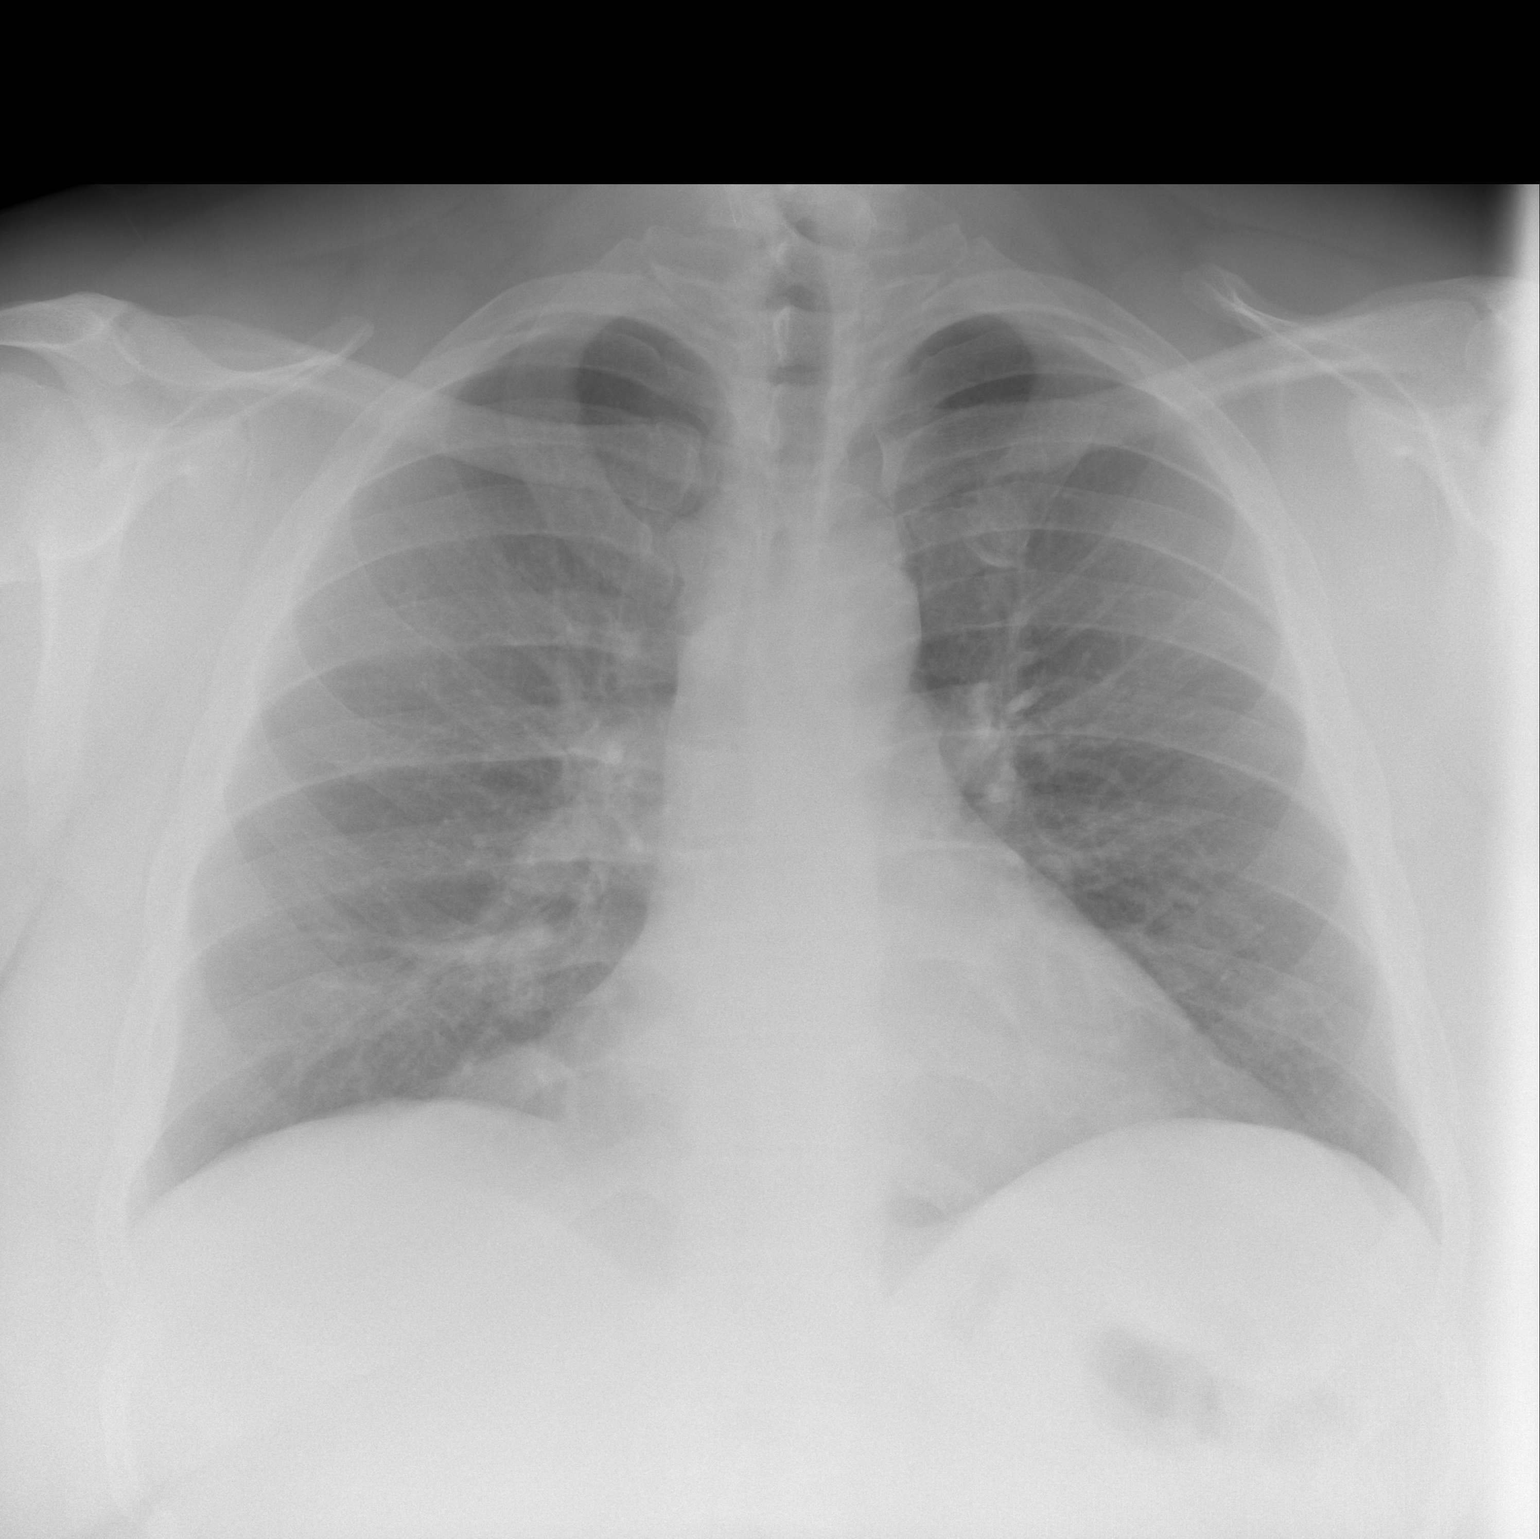

[w chest lat *]
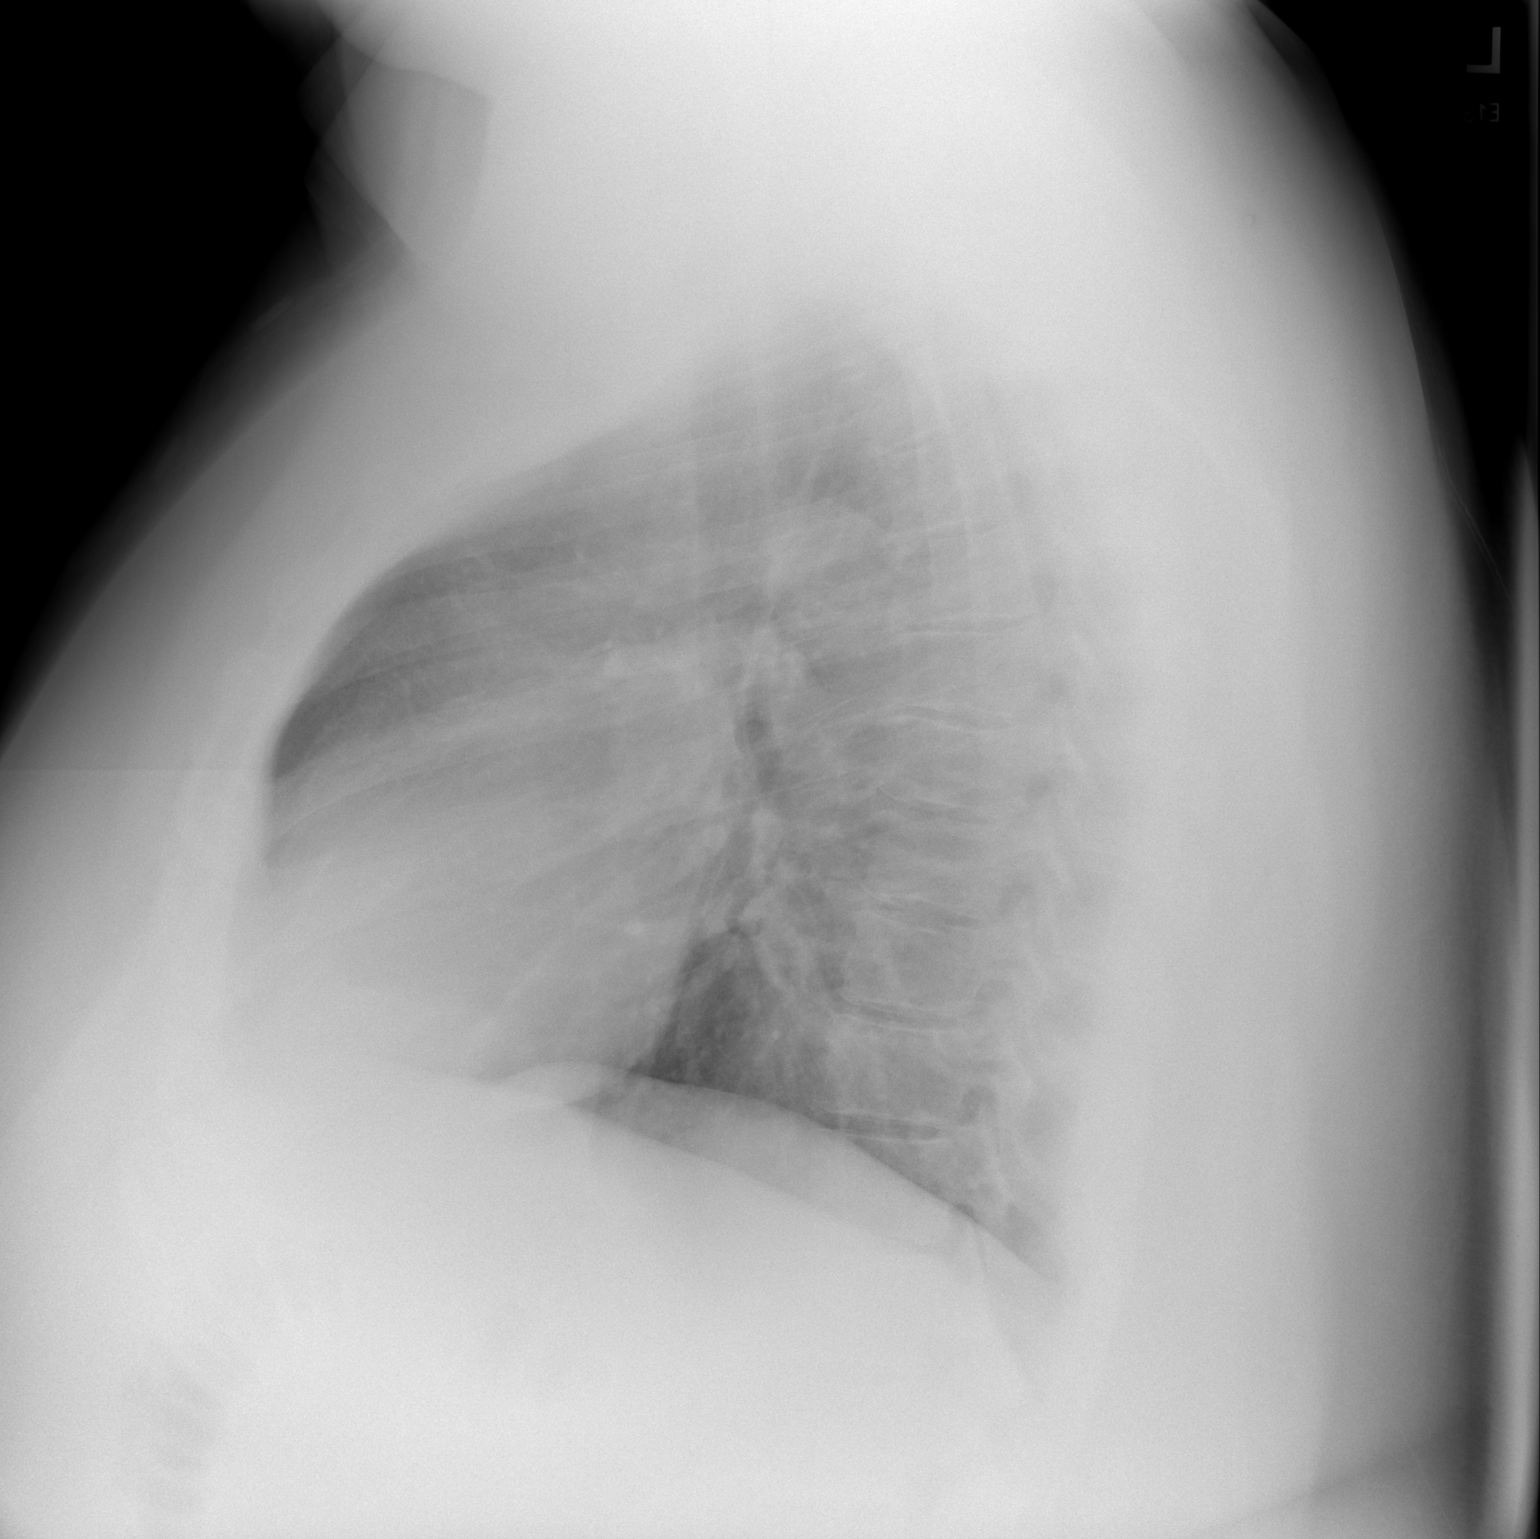

[2 of 2 positions shown; findings below may reference images not displayed]

FINDINGS: The heart size and mediastinal contours are within normal limits.
Both lungs are clear. The visualized skeletal structures are
unremarkable.
IMPRESSION: No active cardiopulmonary disease.

## 2022-07-12 DIAGNOSIS — F332 Major depressive disorder, recurrent severe without psychotic features: Secondary | ICD-10-CM | POA: Diagnosis not present

## 2022-07-12 DIAGNOSIS — F900 Attention-deficit hyperactivity disorder, predominantly inattentive type: Secondary | ICD-10-CM | POA: Diagnosis not present

## 2022-07-12 DIAGNOSIS — F4311 Post-traumatic stress disorder, acute: Secondary | ICD-10-CM | POA: Diagnosis not present

## 2022-07-21 DIAGNOSIS — F4311 Post-traumatic stress disorder, acute: Secondary | ICD-10-CM | POA: Diagnosis not present

## 2022-07-21 DIAGNOSIS — F332 Major depressive disorder, recurrent severe without psychotic features: Secondary | ICD-10-CM | POA: Diagnosis not present

## 2022-07-21 DIAGNOSIS — F900 Attention-deficit hyperactivity disorder, predominantly inattentive type: Secondary | ICD-10-CM | POA: Diagnosis not present

## 2022-08-09 DIAGNOSIS — F4311 Post-traumatic stress disorder, acute: Secondary | ICD-10-CM | POA: Diagnosis not present

## 2022-08-09 DIAGNOSIS — F900 Attention-deficit hyperactivity disorder, predominantly inattentive type: Secondary | ICD-10-CM | POA: Diagnosis not present

## 2022-08-09 DIAGNOSIS — F332 Major depressive disorder, recurrent severe without psychotic features: Secondary | ICD-10-CM | POA: Diagnosis not present

## 2022-08-18 DIAGNOSIS — F4311 Post-traumatic stress disorder, acute: Secondary | ICD-10-CM | POA: Diagnosis not present

## 2022-08-18 DIAGNOSIS — F332 Major depressive disorder, recurrent severe without psychotic features: Secondary | ICD-10-CM | POA: Diagnosis not present

## 2022-08-18 DIAGNOSIS — F900 Attention-deficit hyperactivity disorder, predominantly inattentive type: Secondary | ICD-10-CM | POA: Diagnosis not present

## 2022-08-23 DIAGNOSIS — F902 Attention-deficit hyperactivity disorder, combined type: Secondary | ICD-10-CM | POA: Diagnosis not present

## 2022-08-23 DIAGNOSIS — F411 Generalized anxiety disorder: Secondary | ICD-10-CM | POA: Diagnosis not present

## 2022-08-25 DIAGNOSIS — F4311 Post-traumatic stress disorder, acute: Secondary | ICD-10-CM | POA: Diagnosis not present

## 2022-08-25 DIAGNOSIS — F332 Major depressive disorder, recurrent severe without psychotic features: Secondary | ICD-10-CM | POA: Diagnosis not present

## 2022-08-25 DIAGNOSIS — F900 Attention-deficit hyperactivity disorder, predominantly inattentive type: Secondary | ICD-10-CM | POA: Diagnosis not present

## 2022-09-01 DIAGNOSIS — F900 Attention-deficit hyperactivity disorder, predominantly inattentive type: Secondary | ICD-10-CM | POA: Diagnosis not present

## 2022-09-01 DIAGNOSIS — F332 Major depressive disorder, recurrent severe without psychotic features: Secondary | ICD-10-CM | POA: Diagnosis not present

## 2022-09-01 DIAGNOSIS — F4311 Post-traumatic stress disorder, acute: Secondary | ICD-10-CM | POA: Diagnosis not present

## 2022-09-15 DIAGNOSIS — F900 Attention-deficit hyperactivity disorder, predominantly inattentive type: Secondary | ICD-10-CM | POA: Diagnosis not present

## 2022-09-15 DIAGNOSIS — F4311 Post-traumatic stress disorder, acute: Secondary | ICD-10-CM | POA: Diagnosis not present

## 2022-09-15 DIAGNOSIS — F332 Major depressive disorder, recurrent severe without psychotic features: Secondary | ICD-10-CM | POA: Diagnosis not present

## 2022-09-27 ENCOUNTER — Encounter: Payer: BC Managed Care – PPO | Attending: General Surgery | Admitting: Skilled Nursing Facility1

## 2022-09-27 ENCOUNTER — Encounter: Payer: Self-pay | Admitting: Skilled Nursing Facility1

## 2022-09-27 VITALS — Ht 72.0 in | Wt 322.2 lb

## 2022-09-27 DIAGNOSIS — E669 Obesity, unspecified: Secondary | ICD-10-CM | POA: Insufficient documentation

## 2022-09-27 NOTE — Progress Notes (Signed)
Bariatric Nutrition Follow-Up Visit Medical Nutrition Therapy   NUTRITION ASSESSMENT    Surgery date: 12/21/2021 Surgery type: sleeve Start weight at [facility]: 404.6 pounds Weight today: 322.2 pounds    Body Composition Scale 01/04/2022 02/16/2022 06/08/2022 09/27/2022  Current Body Weight 380.2 366.3 326.7 322.2  Total Body Fat % 41.2 40.3 37.2 36.9  Visceral Fat 36 34 29 28  Fat-Free Mass % 58.7 59.6 62.7 63   Total Body Water % 39.7 40.6 43.7 44  Muscle-Mass lbs 71.8 69.2 61.7 60.8  BMI 51.8 49.9 44.4 43.8  Body Fat Displacement             Torso  lbs 97.4 91.8 75.6 73.9         Left Leg  lbs 19.4 18.3 15.1 14.7         Right Leg  lbs 19.4 18.3 15.1 14.7         Left Arm  lbs 9.7 9.1 7.5 7.3         Right Arm   lbs 9.7 9.1 7.5 7.3    Clinical  Medical hx: ADHD, sciatica  Medications: Focalin, bupropion   Labs:  Notable signs/symptoms: back pain Any previous deficiencies? No   Lifestyle & Dietary Hx  Pt states on his dates he will drink multiple servings whiskey maybe 6+ ounces as a guess.  Pt states he does pretty good with stopping at satisfaction rather than to fullness. Pt states he noticed he was getting munchies with a previous product but tried something different not as munchies. Pt states he wants to get to 1X by his one year. Pt states he did move his treadmill to in front of the TV to be more active which has helped.    Estimated daily fluid intake: 64+ oz Estimated daily protein intake: 80 g Supplements: multi and calcium  Current average weekly physical activity:  walking 4-5 days a week 30-45 minutes   24-Hr Dietary Recall First Meal: apple or blueberries or orange + nut based protein bar or lara bar or boiled eggs or scrambled eggs Snack: nut bar or protein bar or jerky Second Meal: organic low sodium deli meat + cheese + pickle Snack: nut bar or protein bar or jerky Third Meal: grilled chicken and chic pea noodles Snack: protein and veggie Beverages:  water, 3 servings liquor most days of the week, coffee + half and half  Post-Op Goals/ Signs/ Symptoms Using straws: no Drinking while eating: no Chewing/swallowing difficulties: no Changes in vision: no Changes to mood/headaches: no Hair loss/changes to skin/nails: no Difficulty focusing/concentrating: no Sweating: no Limb weakness: no Dizziness/lightheadedness: no Palpitations: no  Carbonated/caffeinated beverages: no N/V/D/C/Gas: no Abdominal pain: no Dumping syndrome: no    NUTRITION DIAGNOSIS  Overweight/obesity (-3.3) related to past poor dietary habits and physical inactivity as evidenced by completed bariatric surgery and following dietary guidelines for continued weight loss and healthy nutrition status.     NUTRITION INTERVENTION Nutrition counseling (C-1) and education (E-2) to facilitate bariatric surgery goals, including: Pt self advanced diet to include all foods The importance of consuming adequate calories as well as certain nutrients daily due to the body's need for essential vitamins, minerals, and fats The importance of daily physical activity and to reach a goal of at least 150 minutes of moderate to vigorous physical activity weekly (or as directed by their physician) due to benefits such as increased musculature and improved lab values The importance of intuitive eating specifically learning hunger-satiety cues and understanding the  importance of learning a new body: The importance of mindful eating to avoid grazing behaviors  Caloric value of alcohol and appropriate serving sizes for a man Caloric value of candy and it being a source of excess calories leading to weight gain Description of a healthy body after bariatric surgery The importance of perception and how your social group influences it  Why you need complex carbohydrates: Whole grains and other complex carbohydrates are required to have a healthy diet. Whole grains provide fiber which can help with  blood glucose levels and help keep you satiated. Fruits and starchy vegetables provide essential vitamins and minerals required for immune function, eyesight support, brain support, bone density, wound healing and many other functions within the body. According to the current evidenced based 2020-2025 Dietary Guidelines for Americans, complex carbohydrates are part of a healthy eating pattern which is associated with a decreased risk for type 2 diabetes, cancers, and cardiovascular disease.  Caloric content of alcohol   Goals: Add 1 ounce of water to whisky to help decrease the caloric amount Add non starchy vegetables to lunch and dinner  If you want a snack after dinner let it be non starchy vegetables  Be consistent with 30 minute walks 5 days a week  Handouts Previously Provided Include  [facility]EL[facility] Flyer Meal ideas sheet inclusive of complex carbohydrate sources   Learning Style & Readiness for Change Teaching method utilized: Visual & Auditory  Demonstrated degree of understanding via: Teach Back  Readiness Level: Action Barriers to learning/adherence to lifestyle change: social influences and traditions  RD's Notes for Next Visit Assess adherence to pt chosen goals  MONITORING & EVALUATION Dietary intake, weekly physical activity, body weight  Next Steps Patient is to follow-up in 1 month

## 2022-10-05 DIAGNOSIS — Z9884 Bariatric surgery status: Secondary | ICD-10-CM | POA: Diagnosis not present

## 2022-10-05 DIAGNOSIS — N529 Male erectile dysfunction, unspecified: Secondary | ICD-10-CM | POA: Diagnosis not present

## 2022-10-05 DIAGNOSIS — I872 Venous insufficiency (chronic) (peripheral): Secondary | ICD-10-CM | POA: Diagnosis not present

## 2022-10-06 DIAGNOSIS — F900 Attention-deficit hyperactivity disorder, predominantly inattentive type: Secondary | ICD-10-CM | POA: Diagnosis not present

## 2022-10-06 DIAGNOSIS — F4311 Post-traumatic stress disorder, acute: Secondary | ICD-10-CM | POA: Diagnosis not present

## 2022-10-06 DIAGNOSIS — F332 Major depressive disorder, recurrent severe without psychotic features: Secondary | ICD-10-CM | POA: Diagnosis not present

## 2022-10-20 DIAGNOSIS — F900 Attention-deficit hyperactivity disorder, predominantly inattentive type: Secondary | ICD-10-CM | POA: Diagnosis not present

## 2022-10-20 DIAGNOSIS — F332 Major depressive disorder, recurrent severe without psychotic features: Secondary | ICD-10-CM | POA: Diagnosis not present

## 2022-10-20 DIAGNOSIS — F4311 Post-traumatic stress disorder, acute: Secondary | ICD-10-CM | POA: Diagnosis not present

## 2022-11-03 DIAGNOSIS — F411 Generalized anxiety disorder: Secondary | ICD-10-CM | POA: Diagnosis not present

## 2022-11-03 DIAGNOSIS — F902 Attention-deficit hyperactivity disorder, combined type: Secondary | ICD-10-CM | POA: Diagnosis not present

## 2022-11-09 ENCOUNTER — Ambulatory Visit: Payer: BC Managed Care – PPO | Admitting: Skilled Nursing Facility1

## 2022-11-10 DIAGNOSIS — F4311 Post-traumatic stress disorder, acute: Secondary | ICD-10-CM | POA: Diagnosis not present

## 2022-11-10 DIAGNOSIS — F900 Attention-deficit hyperactivity disorder, predominantly inattentive type: Secondary | ICD-10-CM | POA: Diagnosis not present

## 2022-11-10 DIAGNOSIS — F332 Major depressive disorder, recurrent severe without psychotic features: Secondary | ICD-10-CM | POA: Diagnosis not present

## 2022-11-22 DIAGNOSIS — M7731 Calcaneal spur, right foot: Secondary | ICD-10-CM | POA: Diagnosis not present

## 2022-11-22 DIAGNOSIS — M722 Plantar fascial fibromatosis: Secondary | ICD-10-CM | POA: Diagnosis not present

## 2022-11-24 DIAGNOSIS — F4311 Post-traumatic stress disorder, acute: Secondary | ICD-10-CM | POA: Diagnosis not present

## 2022-11-24 DIAGNOSIS — F900 Attention-deficit hyperactivity disorder, predominantly inattentive type: Secondary | ICD-10-CM | POA: Diagnosis not present

## 2022-11-24 DIAGNOSIS — F332 Major depressive disorder, recurrent severe without psychotic features: Secondary | ICD-10-CM | POA: Diagnosis not present

## 2022-12-05 DIAGNOSIS — F902 Attention-deficit hyperactivity disorder, combined type: Secondary | ICD-10-CM | POA: Diagnosis not present

## 2022-12-05 DIAGNOSIS — F411 Generalized anxiety disorder: Secondary | ICD-10-CM | POA: Diagnosis not present

## 2022-12-08 DIAGNOSIS — F4311 Post-traumatic stress disorder, acute: Secondary | ICD-10-CM | POA: Diagnosis not present

## 2022-12-08 DIAGNOSIS — F900 Attention-deficit hyperactivity disorder, predominantly inattentive type: Secondary | ICD-10-CM | POA: Diagnosis not present

## 2022-12-08 DIAGNOSIS — F332 Major depressive disorder, recurrent severe without psychotic features: Secondary | ICD-10-CM | POA: Diagnosis not present

## 2023-01-02 DIAGNOSIS — I872 Venous insufficiency (chronic) (peripheral): Secondary | ICD-10-CM | POA: Diagnosis not present

## 2023-01-02 DIAGNOSIS — L810 Postinflammatory hyperpigmentation: Secondary | ICD-10-CM | POA: Diagnosis not present

## 2023-01-06 DIAGNOSIS — F332 Major depressive disorder, recurrent severe without psychotic features: Secondary | ICD-10-CM | POA: Diagnosis not present

## 2023-01-06 DIAGNOSIS — F900 Attention-deficit hyperactivity disorder, predominantly inattentive type: Secondary | ICD-10-CM | POA: Diagnosis not present

## 2023-01-06 DIAGNOSIS — F4311 Post-traumatic stress disorder, acute: Secondary | ICD-10-CM | POA: Diagnosis not present

## 2023-01-12 DIAGNOSIS — F332 Major depressive disorder, recurrent severe without psychotic features: Secondary | ICD-10-CM | POA: Diagnosis not present

## 2023-01-12 DIAGNOSIS — F4311 Post-traumatic stress disorder, acute: Secondary | ICD-10-CM | POA: Diagnosis not present

## 2023-01-12 DIAGNOSIS — F900 Attention-deficit hyperactivity disorder, predominantly inattentive type: Secondary | ICD-10-CM | POA: Diagnosis not present

## 2023-01-19 DIAGNOSIS — F900 Attention-deficit hyperactivity disorder, predominantly inattentive type: Secondary | ICD-10-CM | POA: Diagnosis not present

## 2023-01-19 DIAGNOSIS — F4311 Post-traumatic stress disorder, acute: Secondary | ICD-10-CM | POA: Diagnosis not present

## 2023-01-19 DIAGNOSIS — F332 Major depressive disorder, recurrent severe without psychotic features: Secondary | ICD-10-CM | POA: Diagnosis not present

## 2023-01-28 DIAGNOSIS — F900 Attention-deficit hyperactivity disorder, predominantly inattentive type: Secondary | ICD-10-CM | POA: Diagnosis not present

## 2023-01-28 DIAGNOSIS — F332 Major depressive disorder, recurrent severe without psychotic features: Secondary | ICD-10-CM | POA: Diagnosis not present

## 2023-01-28 DIAGNOSIS — F4311 Post-traumatic stress disorder, acute: Secondary | ICD-10-CM | POA: Diagnosis not present

## 2023-02-02 DIAGNOSIS — F4311 Post-traumatic stress disorder, acute: Secondary | ICD-10-CM | POA: Diagnosis not present

## 2023-02-02 DIAGNOSIS — F900 Attention-deficit hyperactivity disorder, predominantly inattentive type: Secondary | ICD-10-CM | POA: Diagnosis not present

## 2023-02-02 DIAGNOSIS — F332 Major depressive disorder, recurrent severe without psychotic features: Secondary | ICD-10-CM | POA: Diagnosis not present

## 2023-02-06 DIAGNOSIS — F411 Generalized anxiety disorder: Secondary | ICD-10-CM | POA: Diagnosis not present

## 2023-02-06 DIAGNOSIS — F902 Attention-deficit hyperactivity disorder, combined type: Secondary | ICD-10-CM | POA: Diagnosis not present

## 2023-03-06 DIAGNOSIS — F902 Attention-deficit hyperactivity disorder, combined type: Secondary | ICD-10-CM | POA: Diagnosis not present

## 2023-03-06 DIAGNOSIS — F411 Generalized anxiety disorder: Secondary | ICD-10-CM | POA: Diagnosis not present

## 2023-05-14 ENCOUNTER — Encounter (HOSPITAL_COMMUNITY): Payer: Self-pay

## 2023-05-14 ENCOUNTER — Ambulatory Visit (HOSPITAL_COMMUNITY)
Admission: EM | Admit: 2023-05-14 | Discharge: 2023-05-14 | Disposition: A | Discharge status: Home / Self Care | Payer: BC Managed Care – PPO | Attending: Family | Admitting: Family

## 2023-05-14 DIAGNOSIS — H6123 Impacted cerumen, bilateral: Secondary | ICD-10-CM

## 2023-05-14 DIAGNOSIS — H9201 Otalgia, right ear: Secondary | ICD-10-CM

## 2023-05-14 NOTE — ED Triage Notes (Signed)
Patient c/o right earache x 2-3 days.  Patient has not had any medications for his symptom.

## 2023-05-14 NOTE — ED Provider Notes (Signed)
[facility]    CSN: [id number] Arrival date & time: 05/14/23  1011      History   Chief Complaint Chief Complaint  Patient presents with   Otalgia   HPI Eric Blackwell is a 37 y.o. male who presented to urgent care this morning with complaints of feeling "uncomfortable" to the right ear for the past 2 to 3 days.  Patient [place], yesterday he woke up feeling his ear was plugged and uncomfortable. Patient reports his symptoms are worse to the right side than the left.  Patient reports history of otitis media in the past. He does not smoke nor he was exposed to smoking recently. Patient is allergic to sulfa, currently taking Prozac and Vyvanse.  Denies fever, chills, CP, SOB, headache, runny nose, decreased hearing or decreased hearing.  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   Moderate obstructive sleep apnea-hypopnea syndrome 11/08/2021   Morbid obesity (HCC) 10/04/2021   Super obesity 10/04/2021   Snoring 10/04/2021   Preoperative evaluation to rule out surgical contraindication 10/04/2021    Past Surgical History:  Procedure Laterality Date   hemmhroidectomy     HIATAL HERNIA REPAIR N/A 12/21/2021   Procedure: HERNIA REPAIR HIATAL;  Surgeon: Gaynelle Adu, MD;  Location: [facility];  Service: General;  Laterality: N/A;   LAPAROSCOPIC GASTRIC SLEEVE RESECTION N/A 12/21/2021   Procedure: LAPAROSCOPIC SLEEVE GASTRECTOMY;  Surgeon: Gaynelle Adu, MD;  Location: [facility];  Service: General;  Laterality: N/A;   UPPER GI ENDOSCOPY N/A 12/21/2021   Procedure: UPPER GI ENDOSCOPY;  Surgeon: Gaynelle Adu, MD;  Location: [facility];  Service: General;  Laterality: N/A;   VARICOSE VEIN SURGERY         Home  Medications    Prior to Admission medications   Medication Sig Start Date End Date Taking? Authorizing Provider  FLUoxetine (PROZAC) 20 MG tablet Take 20 mg by mouth daily.   Yes [provider]  calcium carbonate (TUMS - DOSED IN MG ELEMENTAL CALCIUM) 500 MG chewable tablet Chew 1 tablet by mouth 3 (three) times daily as needed for indigestion or heartburn.    [provider]  enoxaparin (LOVENOX) 60 MG/0.6ML injection Inject 0.6 mLs (60 mg total) into the skin 2 (two) times daily for 14 days. 12/22/21 01/05/22  Gaynelle Adu, MD  guaiFENesin (MUCINEX) 600 MG 12 hr tablet Take 600 mg by mouth 2 (two) times daily.    [provider]  ondansetron (ZOFRAN-ODT) 4 MG disintegrating tablet Dissolve 1 tablet (4 mg total) by mouth every 6 (six) hours as needed for nausea or vomiting. 12/22/21   Gaynelle Adu, MD  pantoprazole (PROTONIX) 40 MG tablet Take 1 tablet (40 mg total) by mouth daily. 12/22/21   Gaynelle Adu, MD  tadalafil (CIALIS) 20 MG tablet Take 20 mg by mouth daily as needed for erectile dysfunction.    [provider]  traMADol (ULTRAM) 50 MG tablet Take 1 tablet (50 mg total) by mouth every 6 (six) hours as needed (pain). 12/22/21   Gaynelle Adu, MD    Family History Family History  Problem Relation Age of Onset   Hypertension  Mother    High Cholesterol Mother    High Cholesterol Father    Hypertension Father    Diabetes Father    Social History Social History   Tobacco Use   Smoking status: Some Days    Types: Cigars   Smokeless tobacco: Never  Vaping Use   Vaping status: Never Used  Substance Use Topics   Alcohol use: Yes   Drug use: Yes    Types: Marijuana     Allergies   Sulfa antibiotics   Review of Systems Review of Systems  Constitutional: Negative.   HENT:  Positive for ear pain. Negative for hearing loss, sneezing and sore throat.   Eyes: Negative.   Respiratory: Negative.    Cardiovascular: Negative.   Neurological:  Negative.    Physical Exam Triage Vital Signs ED Triage Vitals  Encounter Vitals Group     BP 05/14/23 1043 134/85     Systolic BP Percentile --      Diastolic BP Percentile --      Pulse Rate 05/14/23 1043 70     Resp 05/14/23 1043 16     Temp 05/14/23 1043 98.1 F (36.7 C)     Temp Source 05/14/23 1043 Oral     SpO2 05/14/23 1043 98 %     Weight --      Height --      Head Circumference --      Peak Flow --      Pain Score 05/14/23 1045 8     Pain Loc --      Pain Education --      Exclude from Growth Chart --    No data found.  Updated Vital Signs BP 134/85 (BP Location: Left Arm)   Pulse 70   Temp 98.1 F (36.7 C) (Oral)   Resp 16   SpO2 98%   Visual Acuity Right Eye Distance:   Left Eye Distance:   Bilateral Distance:    Right Eye Near:   Left Eye Near:    Bilateral Near:     Physical Exam Constitutional:      Appearance: Normal appearance.  HENT:     Head: Normocephalic and atraumatic.     Ears:     Comments: Unable to visualize left tympanic membrane due to earwax impaction.  Minimal earwax appreciated to the right side with minimal redness to TM edges.  No discharge.    Nose: Nose normal.  Cardiovascular:     Rate and Rhythm: Normal rate and regular rhythm.  Pulmonary:     Effort: Pulmonary effort is normal.     Breath sounds: Normal breath sounds.  Abdominal:     General: Bowel sounds are normal.  Musculoskeletal:     Cervical back: Normal range of motion.  Neurological:     Mental Status: He is alert.    ADDENDUM:  Earwax removal procedure was done by the nursing staff.  Patient tolerated the procedure well.  Postprocedure shows intact tympanic membranes both sides.  Redness to both ear canal without discharge or sign of infection appreciated.  UC Treatments / Results  Labs (all labs ordered are listed, but only abnormal results are displayed) Labs Reviewed - No data to display  EKG   Radiology No results found.  Procedures Ear  Cerumen Removal  Date/Time: 05/14/2023 12:05 PM  Performed by: Eleonore Chiquito, FNP Authorized by: Eleonore Chiquito, FNP   Consent:    Consent obtained:  Verbal   Consent given by:  Patient  Risks, benefits, and alternatives were discussed: yes     Risks discussed:  Bleeding, TM perforation, incomplete removal, dizziness, infection and pain   Alternatives discussed:  No treatment Universal protocol:    Procedure explained and questions answered to patient or proxy's satisfaction: yes   Procedure details:    Location:  L ear and R ear   Procedure outcomes: cerumen removed   Post-procedure details:    Inspection:  Ear canal clear, no bleeding and TM intact   Hearing quality:  Normal   Procedure completion:  Tolerated  (including critical care time)  Medications Ordered in UC Medications - No data to display  Initial Impression / Assessment and Plan / UC Course  I have reviewed the triage vital signs and the nursing notes. Pertinent labs & imaging results that were available during my care of the patient were reviewed by me and considered in my medical decision making (see chart for details).  1) otalgia of right ear:  -Stay hydrated  -Take Tylenol or ibuprofen for pain as needed.  -Report new or worsening symptoms to the urgent care or primary care's office/ED    2) bilateral impacted cerumen:   -Use over-the-counter earwax removal as needed  -Report new or worsening symptoms to the urgent care or primary care's office/ED  -Follow-up with primary care provider as needed.   Final Clinical Impressions(s) / UC Diagnoses   Final diagnoses:  Otalgia of right ear  Bilateral impacted cerumen   Discharge Instructions   None    ED Prescriptions   None    PDMP not reviewed this encounter.   Eleonore Chiquito, FNP 05/14/23 1207

## 2023-05-14 NOTE — Discharge Instructions (Addendum)
1) take Tylenol or ibuprofen for ear pain. 2) for new or worsening symptoms to the emergency room or primary care provider's office. 3) can use over-the-counter earwax removal as needed, ask guidance from local pharmacists. 4) Stay dehydrated

## 2023-07-17 ENCOUNTER — Encounter (HOSPITAL_COMMUNITY): Payer: Self-pay | Admitting: *Deleted

## 2023-10-10 ENCOUNTER — Other Ambulatory Visit: Payer: Self-pay | Admitting: Family Medicine

## 2023-10-10 DIAGNOSIS — R519 Headache, unspecified: Secondary | ICD-10-CM

## 2023-10-10 DIAGNOSIS — R42 Dizziness and giddiness: Secondary | ICD-10-CM

## 2023-10-10 DIAGNOSIS — M542 Cervicalgia: Secondary | ICD-10-CM

## 2023-11-02 ENCOUNTER — Other Ambulatory Visit: Payer: Self-pay

## 2023-12-22 ENCOUNTER — Ambulatory Visit (HOSPITAL_COMMUNITY)
Admission: EM | Admit: 2023-12-22 | Discharge: 2023-12-22 | Disposition: A | Discharge status: Home / Self Care | Payer: 59 | Attending: Physician Assistant | Admitting: Physician Assistant

## 2023-12-22 ENCOUNTER — Encounter (HOSPITAL_COMMUNITY): Payer: Self-pay

## 2023-12-22 DIAGNOSIS — J111 Influenza due to unidentified influenza virus with other respiratory manifestations: Secondary | ICD-10-CM

## 2023-12-22 LAB — POC COVID19/FLU A&B COMBO
Covid Antigen, POC: NEGATIVE
Influenza A Antigen, POC: NEGATIVE
Influenza B Antigen, POC: NEGATIVE

## 2023-12-22 MED ORDER — OSELTAMIVIR PHOSPHATE 75 MG PO CAPS: 75.0000 mg | ORAL_CAPSULE | Freq: Two times a day (BID) | ORAL | 0 refills | Status: AC

## 2023-12-22 NOTE — ED Triage Notes (Signed)
 Pt states cough,fever,runny nose,body aches and chill since this morning.  States his wife and kids tested positive for the flu. States he has not taken anything at home for his symptoms.

## 2023-12-22 NOTE — Discharge Instructions (Signed)
 At this time I suspect you have the flu even though your testing was negative.   The goal of treatment at this time is to reduce your symptoms and discomfort   You can use over the counter medications such as Dayquil/Nyquil, AlkaSeltzer formulations, etc to provide further relief of symptoms according to the manufacturer's instructions   I have sent in tamiflu for you to take for the flu. This is most effective when started within 48 hours of symptom onset. Please take as directed for the full course unless you develop side effects or an allergic reaction   Please make sure that you are increasing your fluid intake and staying well-hydrated.  If you do not feel like eating you can try supplementing with meal replacement shakes/drinks such as Ensure or you can use protein smoothies or Gatorade/Pedialyte as desired.  If your symptoms do not improve or become worse in the next 5-7 days follow-up with your PCP or return to urgent care. Go to the ER if you begin to have more serious symptoms such as shortness of breath, trouble breathing, loss of consciousness, swelling around the eyes, high fever, severe lasting headaches, vision changes or neck pain/stiffness.

## 2023-12-22 NOTE — ED Provider Notes (Signed)
 [facility]    CSN: [id number] Arrival date & time: 12/22/23  1650      History   Chief Complaint Chief Complaint  Patient presents with   Cough    HPI Eric Blackwell is a 38 y.o. male.   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   Moderate obstructive sleep apnea-hypopnea syndrome 11/08/2021   Morbid obesity (HCC) 10/04/2021   Super obesity 10/04/2021   Snoring 10/04/2021   Preoperative evaluation to rule out surgical contraindication 10/04/2021    Past Surgical History:  Procedure Laterality Date   hemmhroidectomy     HIATAL HERNIA REPAIR N/A 12/21/2021   Procedure: HERNIA REPAIR HIATAL;  Surgeon: Gaynelle Adu, MD;  Location: [facility];  Service: General;  Laterality: N/A;   LAPAROSCOPIC GASTRIC SLEEVE RESECTION N/A 12/21/2021   Procedure: LAPAROSCOPIC SLEEVE GASTRECTOMY;  Surgeon: Gaynelle Adu, MD;  Location: [facility];  Service: General;  Laterality: N/A;   UPPER GI ENDOSCOPY N/A 12/21/2021   Procedure: UPPER GI ENDOSCOPY;  Surgeon: Gaynelle Adu, MD;  Location: [facility];  Service: General;  Laterality: N/A;   VARICOSE VEIN SURGERY         Home Medications    Prior to Admission medications   Medication Sig Start Date End Date Taking? Authorizing Provider  oseltamivir (TAMIFLU) 75 MG capsule Take 1 capsule (75 mg total) by mouth 2 (two) times daily for 5 days. 12/22/23 12/27/23 Yes Yaremi Stahlman E, PA-C  calcium  carbonate (TUMS - DOSED IN MG ELEMENTAL CALCIUM) 500 MG chewable tablet Chew 1 tablet by mouth 3 (three) times daily as needed for indigestion or heartburn.    [provider]  enoxaparin (LOVENOX) 60 MG/0.6ML injection Inject 0.6 mLs (60 mg total) into the skin 2 (two) times daily for 14 days. 12/22/21 01/05/22  Gaynelle Adu, MD  FLUoxetine (PROZAC) 20 MG tablet Take 20 mg by mouth daily.    [provider]  guaiFENesin (MUCINEX) 600 MG 12 hr tablet Take 600 mg by mouth 2 (two) times daily.    [provider]  ondansetron (ZOFRAN-ODT) 4 MG disintegrating tablet Dissolve 1 tablet (4 mg total) by mouth every 6 (six) hours as needed for nausea or vomiting. 12/22/21   Gaynelle Adu, MD  pantoprazole (PROTONIX) 40 MG tablet Take 1 tablet (40 mg total) by mouth daily. 12/22/21   Gaynelle Adu, MD  tadalafil (CIALIS) 20 MG tablet Take 20 mg by mouth daily as needed for erectile dysfunction.    [provider]  traMADol (ULTRAM) 50 MG tablet Take 1 tablet (50 mg total) by mouth every 6 (six) hours as needed (pain). 12/22/21   Gaynelle Adu, MD    Family History Family History  Problem Relation Age of Onset   Hypertension Mother    High Cholesterol Mother    High Cholesterol Father  Hypertension Father    Diabetes Father     Social History Social History   Tobacco Use   Smoking status: Some Days    Types: Cigars   Smokeless tobacco: Never  Vaping Use   Vaping status: Never Used  Substance Use Topics   Alcohol use: Yes   Drug use: Yes    Types: Marijuana     Allergies   Sulfa antibiotics   Review of Systems Review of Systems  Constitutional:  Positive for chills, fatigue and fever.  HENT:  Positive for postnasal drip, rhinorrhea and sore throat. Negative for congestion, ear pain, sinus pressure and sinus pain.   Respiratory:  Positive for cough. Negative for shortness of breath and wheezing.   Gastrointestinal:  Positive for diarrhea. Negative for  nausea and vomiting.  Musculoskeletal:  Positive for myalgias.  Neurological:  Positive for headaches.     Physical Exam Triage Vital Signs ED Triage Vitals  Encounter Vitals Group     BP 12/22/23 1803 122/73     Systolic BP Percentile --      Diastolic BP Percentile --      Pulse Rate 12/22/23 1801 90     Resp 12/22/23 1801 16     Temp 12/22/23 1801 100 F (37.8 C)     Temp Source 12/22/23 1801 Oral     SpO2 12/22/23 1801 97 %     Weight --      Height --      Head Circumference --      Peak Flow --      Pain Score --      Pain Loc --      Pain Education --      Exclude from Growth Chart --    No data found.  Updated Vital Signs BP 122/73 (BP Location: Left Arm)   Pulse 90   Temp 100 F (37.8 C) (Oral)   Resp 16   SpO2 97%   Visual Acuity Right Eye Distance:   Left Eye Distance:   Bilateral Distance:    Right Eye Near:   Left Eye Near:    Bilateral Near:     Physical Exam Vitals reviewed.  Constitutional:      General: He is awake.     Appearance: Normal appearance. He is well-developed and well-groomed.  HENT:     Head: Normocephalic and atraumatic.     Right Ear: Hearing and ear canal normal. Tympanic membrane is erythematous.     Left Ear: Hearing, tympanic membrane and ear canal normal.     Mouth/Throat:     Lips: Pink.     Mouth: Mucous membranes are moist.     Pharynx: Oropharynx is clear. Uvula midline. Posterior oropharyngeal erythema present. No pharyngeal swelling, oropharyngeal exudate, uvula swelling or postnasal drip.     Tonsils: No tonsillar exudate or tonsillar abscesses.  Eyes:     General: Lids are normal. Gaze aligned appropriately.     Extraocular Movements: Extraocular movements intact.     Conjunctiva/sclera: Conjunctivae normal.  Cardiovascular:     Rate and Rhythm: Normal rate and regular rhythm.     Heart sounds: Normal heart sounds.  Pulmonary:     Effort: Pulmonary effort is normal.     Breath sounds: Normal breath  sounds. No decreased air movement. No decreased breath sounds, wheezing, rhonchi or rales.  Musculoskeletal:     Cervical back: Normal range of motion and neck supple.  Lymphadenopathy:     Head:  Right side of head: No submental, submandibular or preauricular adenopathy.     Left side of head: No submental, submandibular or preauricular adenopathy.     Cervical:     Right cervical: No superficial cervical adenopathy.    Left cervical: No superficial cervical adenopathy.     Upper Body:     Right upper body: No supraclavicular adenopathy.     Left upper body: No supraclavicular adenopathy.  Skin:    General: Skin is warm and dry.  Neurological:     General: No focal deficit present.     Mental Status: He is alert and oriented to person, place, and time.     GCS: GCS eye subscore is 4. GCS verbal subscore is 5. GCS motor subscore is 6.  Psychiatric:        Attention and Perception: Attention normal.        Mood and Affect: Mood normal.        Speech: Speech normal.        Behavior: Behavior normal. Behavior is cooperative.        Thought Content: Thought content normal.        Judgment: Judgment normal.      UC Treatments / Results  Labs (all labs ordered are listed, but only abnormal results are displayed) Labs Reviewed  POC COVID19/FLU A&B COMBO    EKG   Radiology No results found.  Procedures Procedures (including critical care time)  Medications Ordered in UC Medications - No data to display  Initial Impression / Assessment and Plan / UC Course  I have reviewed the triage vital signs and the nursing notes.  Pertinent labs & imaging results that were available during my care of the patient were reviewed by me and considered in my medical decision making (see chart for details).      Final Clinical Impressions(s) / UC Diagnoses   Final diagnoses:  Influenza-like illness   Acute, new concern Patient reports that he started feeling ill earlier today.   He reports that several members of his household have been diagnosed with the flu in the recent days and one of his children has started developing symptoms today as well.  Reviewed that his rapid flu and COVID testing was negative but it may be too early for this to be a positive.  His symptoms and recent exposure do make me more suspicious for likely flu at this time.  Reviewed that we can start Tamiflu prophylactically as well as start over-the-counter medications for symptomatic management or he can just do over-the-counter medications as needed for symptomatic treatment..  Side effects and mechanism of action of Tamiflu were reviewed with him and he is amenable to starting this along with over-the-counter medications for further symptomatic management.  ED and return precautions reviewed and provided in after visit summary.  Follow-up as needed for progressing or persistent symptoms    Discharge Instructions      At this time I suspect you have the flu even though your testing was negative.   The goal of treatment at this time is to reduce your symptoms and discomfort   You can use over the counter medications such as Dayquil/Nyquil, AlkaSeltzer formulations, etc to provide further relief of symptoms according to the manufacturer's instructions   I have sent in tamiflu for you to take for the flu. This is most effective when started within 48 hours of symptom onset. Please take as directed for the full course unless you develop side effects  or an allergic reaction   Please make sure that you are increasing your fluid intake and staying well-hydrated.  If you do not feel like eating you can try supplementing with meal replacement shakes/drinks such as Ensure or you can use protein smoothies or Gatorade/Pedialyte as desired.  If your symptoms do not improve or become worse in the next 5-7 days follow-up with your PCP or return to urgent care. Go to the ER if you begin to have more serious  symptoms such as shortness of breath, trouble breathing, loss of consciousness, swelling around the eyes, high fever, severe lasting headaches, vision changes or neck pain/stiffness.       ED Prescriptions     Medication Sig Dispense Auth. Provider   oseltamivir (TAMIFLU) 75 MG capsule Take 1 capsule (75 mg total) by mouth 2 (two) times daily for 5 days. 10 capsule Dusti Tetro E, PA-C      PDMP not reviewed this encounter.   Roselind Messier 12/22/23 1922
